# Patient Record
Sex: Male | Born: 1990 | Race: Black or African American | Hispanic: No | Marital: Single | Smoking: Former smoker
Health system: Southern US, Community
[De-identification: ages and names within clinical notes are randomized; demographics above are authoritative.]

## PROBLEM LIST (undated history)

## (undated) DIAGNOSIS — Z8614 Personal history of Methicillin resistant Staphylococcus aureus infection: Secondary | ICD-10-CM

## (undated) DIAGNOSIS — S02609A Fracture of mandible, unspecified, initial encounter for closed fracture: Secondary | ICD-10-CM

## (undated) DIAGNOSIS — Z969 Presence of functional implant, unspecified: Secondary | ICD-10-CM

## (undated) DIAGNOSIS — M2652 Limited mandibular range of motion: Secondary | ICD-10-CM

## (undated) DEATH — deceased

---
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
## 2009-06-10 ENCOUNTER — Emergency Department (HOSPITAL_COMMUNITY): Admission: EM | Admit: 2009-06-10 | Discharge: 2009-06-10 | Payer: Self-pay | Admitting: Emergency Medicine

## 2009-07-27 ENCOUNTER — Emergency Department (HOSPITAL_COMMUNITY): Admission: EM | Admit: 2009-07-27 | Discharge: 2009-07-27 | Payer: Self-pay | Admitting: Emergency Medicine

## 2009-11-30 ENCOUNTER — Emergency Department (HOSPITAL_COMMUNITY): Admission: EM | Admit: 2009-11-30 | Discharge: 2009-12-01 | Payer: Self-pay | Admitting: Emergency Medicine

## 2010-09-06 ENCOUNTER — Observation Stay (HOSPITAL_COMMUNITY): Admission: EM | Admit: 2010-09-06 | Discharge: 2010-09-07 | Payer: Self-pay | Admitting: Emergency Medicine

## 2010-12-18 ENCOUNTER — Emergency Department (HOSPITAL_COMMUNITY)
Admission: EM | Admit: 2010-12-18 | Discharge: 2010-12-18 | Disposition: A | Discharge status: Home / Self Care | Payer: Self-pay | Attending: Emergency Medicine | Admitting: Emergency Medicine

## 2010-12-18 DIAGNOSIS — R109 Unspecified abdominal pain: Secondary | ICD-10-CM | POA: Insufficient documentation

## 2010-12-18 DIAGNOSIS — K921 Melena: Secondary | ICD-10-CM | POA: Insufficient documentation

## 2010-12-18 DIAGNOSIS — R112 Nausea with vomiting, unspecified: Secondary | ICD-10-CM | POA: Insufficient documentation

## 2010-12-18 DIAGNOSIS — R197 Diarrhea, unspecified: Secondary | ICD-10-CM | POA: Insufficient documentation

## 2010-12-18 LAB — URINALYSIS, ROUTINE W REFLEX MICROSCOPIC
Specific Gravity, Urine: 1.03 (ref 1.005–1.030)
Urine Glucose, Fasting: 100 mg/dL — AB
pH: 8 (ref 5.0–8.0)

## 2010-12-18 LAB — CBC
MCHC: 36.4 g/dL — ABNORMAL HIGH (ref 30.0–36.0)
MCV: 83 fL (ref 78.0–100.0)
Platelets: 257 10*3/uL (ref 150–400)
RDW: 12.7 % (ref 11.5–15.5)
WBC: 10.4 10*3/uL (ref 4.0–10.5)

## 2010-12-18 LAB — DIFFERENTIAL
Eosinophils Absolute: 0 10*3/uL (ref 0.0–0.7)
Eosinophils Relative: 0 % (ref 0–5)
Lymphs Abs: 1.2 10*3/uL (ref 0.7–4.0)

## 2010-12-18 LAB — URINE MICROSCOPIC-ADD ON

## 2010-12-18 LAB — COMPREHENSIVE METABOLIC PANEL
Albumin: 4.9 g/dL (ref 3.5–5.2)
Alkaline Phosphatase: 72 U/L (ref 39–117)
BUN: 14 mg/dL (ref 6–23)
Calcium: 10.1 mg/dL (ref 8.4–10.5)
Creatinine, Ser: 0.93 mg/dL (ref 0.4–1.5)
Potassium: 4.4 mEq/L (ref 3.5–5.1)
Total Protein: 8.4 g/dL — ABNORMAL HIGH (ref 6.0–8.3)

## 2010-12-19 ENCOUNTER — Emergency Department (HOSPITAL_COMMUNITY)
Admission: EM | Admit: 2010-12-19 | Discharge: 2010-12-19 | Disposition: A | Discharge status: Home / Self Care | Payer: Self-pay | Attending: Emergency Medicine | Admitting: Emergency Medicine

## 2010-12-19 DIAGNOSIS — R1013 Epigastric pain: Secondary | ICD-10-CM | POA: Insufficient documentation

## 2010-12-19 DIAGNOSIS — R112 Nausea with vomiting, unspecified: Secondary | ICD-10-CM | POA: Insufficient documentation

## 2010-12-19 LAB — URINE MICROSCOPIC-ADD ON

## 2010-12-19 LAB — CBC
HCT: 42.3 % (ref 39.0–52.0)
Hemoglobin: 15.6 g/dL (ref 13.0–17.0)
MCHC: 36.9 g/dL — ABNORMAL HIGH (ref 30.0–36.0)

## 2010-12-19 LAB — DIFFERENTIAL
Basophils Absolute: 0 10*3/uL (ref 0.0–0.1)
Eosinophils Relative: 0 % (ref 0–5)
Lymphocytes Relative: 29 % (ref 12–46)
Monocytes Relative: 11 % (ref 3–12)
Neutro Abs: 6.6 10*3/uL (ref 1.7–7.7)

## 2010-12-19 LAB — COMPREHENSIVE METABOLIC PANEL
ALT: 17 U/L (ref 0–53)
AST: 29 U/L (ref 0–37)
Albumin: 4.8 g/dL (ref 3.5–5.2)
Alkaline Phosphatase: 67 U/L (ref 39–117)
CO2: 22 mEq/L (ref 19–32)
Calcium: 9.7 mg/dL (ref 8.4–10.5)
Chloride: 98 mEq/L (ref 96–112)
Creatinine, Ser: 1.03 mg/dL (ref 0.4–1.5)
Glucose, Bld: 124 mg/dL — ABNORMAL HIGH (ref 70–99)

## 2010-12-19 LAB — URINALYSIS, ROUTINE W REFLEX MICROSCOPIC
Hgb urine dipstick: NEGATIVE
Specific Gravity, Urine: 1.023 (ref 1.005–1.030)
Urine Glucose, Fasting: NEGATIVE mg/dL

## 2010-12-19 LAB — LIPASE, BLOOD: Lipase: 20 U/L (ref 11–59)

## 2010-12-21 ENCOUNTER — Emergency Department (HOSPITAL_COMMUNITY)
Admission: EM | Admit: 2010-12-21 | Discharge: 2010-12-22 | Discharge status: Against Medical Advice | Payer: Self-pay | Attending: Emergency Medicine | Admitting: Emergency Medicine

## 2010-12-21 DIAGNOSIS — Z0389 Encounter for observation for other suspected diseases and conditions ruled out: Secondary | ICD-10-CM | POA: Insufficient documentation

## 2010-12-23 ENCOUNTER — Emergency Department (HOSPITAL_COMMUNITY)
Admission: EM | Admit: 2010-12-23 | Discharge: 2010-12-23 | Disposition: A | Discharge status: Home / Self Care | Payer: Self-pay | Attending: Emergency Medicine | Admitting: Emergency Medicine

## 2010-12-23 ENCOUNTER — Emergency Department (HOSPITAL_COMMUNITY): Payer: Self-pay

## 2010-12-23 DIAGNOSIS — R1013 Epigastric pain: Secondary | ICD-10-CM | POA: Insufficient documentation

## 2010-12-23 DIAGNOSIS — R112 Nausea with vomiting, unspecified: Secondary | ICD-10-CM | POA: Insufficient documentation

## 2010-12-23 LAB — POCT I-STAT, CHEM 8
Glucose, Bld: 92 mg/dL (ref 70–99)
HCT: 46 % (ref 39.0–52.0)
Hemoglobin: 15.6 g/dL (ref 13.0–17.0)
Potassium: 3.4 mEq/L — ABNORMAL LOW (ref 3.5–5.1)
TCO2: 24 mmol/L (ref 0–100)

## 2010-12-23 LAB — OCCULT BLOOD, POC DEVICE: Fecal Occult Bld: NEGATIVE

## 2010-12-24 DIAGNOSIS — Z8614 Personal history of Methicillin resistant Staphylococcus aureus infection: Secondary | ICD-10-CM

## 2010-12-24 HISTORY — DX: Personal history of Methicillin resistant Staphylococcus aureus infection: Z86.14

## 2011-01-05 LAB — COMPREHENSIVE METABOLIC PANEL
ALT: 10 U/L (ref 0–53)
AST: 20 U/L (ref 0–37)
Albumin: 3.7 g/dL (ref 3.5–5.2)
Calcium: 9.1 mg/dL (ref 8.4–10.5)
GFR calc Af Amer: >60 mL/min (ref >60)
Sodium: 139 mEq/L (ref 135–145)
Total Protein: 6.7 g/dL (ref 6.0–8.3)

## 2011-01-05 LAB — DIFFERENTIAL
Eosinophils Absolute: 0.1 10*3/uL (ref 0.0–0.7)
Eosinophils Relative: 1 % (ref 0–5)
Lymphocytes Relative: 15 % (ref 12–46)
Lymphs Abs: 1.5 10*3/uL (ref 0.7–4.0)
Monocytes Relative: 10 % (ref 3–12)

## 2011-01-05 LAB — CBC
Hemoglobin: 14.5 g/dL (ref 13.0–17.0)
MCHC: 35.4 g/dL (ref 30.0–36.0)
Platelets: 162 10*3/uL (ref 150–400)
RDW: 13.1 % (ref 11.5–15.5)

## 2011-01-08 ENCOUNTER — Inpatient Hospital Stay (HOSPITAL_COMMUNITY)
Admission: EM | Admit: 2011-01-08 | Discharge: 2011-01-12 | DRG: 513 | Attending: Internal Medicine | Admitting: Internal Medicine

## 2011-01-08 ENCOUNTER — Emergency Department (HOSPITAL_COMMUNITY)

## 2011-01-08 DIAGNOSIS — R042 Hemoptysis: Secondary | ICD-10-CM | POA: Diagnosis present

## 2011-01-08 DIAGNOSIS — J9600 Acute respiratory failure, unspecified whether with hypoxia or hypercapnia: Secondary | ICD-10-CM | POA: Diagnosis not present

## 2011-01-08 DIAGNOSIS — K2970 Gastritis, unspecified, without bleeding: Secondary | ICD-10-CM | POA: Diagnosis present

## 2011-01-08 DIAGNOSIS — F17200 Nicotine dependence, unspecified, uncomplicated: Secondary | ICD-10-CM | POA: Diagnosis present

## 2011-01-08 DIAGNOSIS — K2990 Gastroduodenitis, unspecified, without bleeding: Secondary | ICD-10-CM | POA: Diagnosis present

## 2011-01-08 DIAGNOSIS — J811 Chronic pulmonary edema: Secondary | ICD-10-CM | POA: Diagnosis not present

## 2011-01-08 DIAGNOSIS — M659 Unspecified synovitis and tenosynovitis, unspecified site: Principal | ICD-10-CM | POA: Diagnosis present

## 2011-01-08 HISTORY — PX: TRIGGER FINGER RELEASE: SHX641

## 2011-01-08 HISTORY — PX: FINGER DEBRIDEMENT: SHX1634

## 2011-01-08 LAB — CBC
HCT: 40 % (ref 39.0–52.0)
Hemoglobin: 14.6 g/dL (ref 13.0–17.0)
MCH: 31 pg (ref 26.0–34.0)
MCHC: 36.5 g/dL — ABNORMAL HIGH (ref 30.0–36.0)
MCV: 84.9 fL (ref 78.0–100.0)
Platelets: 264 10*3/uL (ref 150–400)
RBC: 4.71 MIL/uL (ref 4.22–5.81)
RDW: 12.1 % (ref 11.5–15.5)
WBC: 10.3 K/uL (ref 4.0–10.5)

## 2011-01-08 LAB — DIFFERENTIAL
Basophils Absolute: 0 K/uL (ref 0.0–0.1)
Basophils Relative: 0 % (ref 0–1)
Eosinophils Absolute: 0.1 10*3/uL (ref 0.0–0.7)
Eosinophils Relative: 1 % (ref 0–5)
Lymphocytes Relative: 25 % (ref 12–46)
Lymphs Abs: 2.6 10*3/uL (ref 0.7–4.0)
Monocytes Absolute: 1.1 K/uL — ABNORMAL HIGH (ref 0.1–1.0)
Monocytes Relative: 11 % (ref 3–12)
Neutro Abs: 6.5 K/uL (ref 1.7–7.7)
Neutrophils Relative %: 63 % (ref 43–77)

## 2011-01-09 ENCOUNTER — Inpatient Hospital Stay (HOSPITAL_COMMUNITY)

## 2011-01-09 DIAGNOSIS — R042 Hemoptysis: Secondary | ICD-10-CM

## 2011-01-09 DIAGNOSIS — J168 Pneumonia due to other specified infectious organisms: Secondary | ICD-10-CM

## 2011-01-09 DIAGNOSIS — R0902 Hypoxemia: Secondary | ICD-10-CM

## 2011-01-09 LAB — CARDIAC PANEL(CRET KIN+CKTOT+MB+TROPI)
CK, MB: 1.8 ng/mL (ref 0.3–4.0)
Relative Index: INVALID (ref 0.0–2.5)
Relative Index: INVALID (ref 0.0–2.5)
Total CK: 60 U/L (ref 7–232)
Troponin I: 0.01 ng/mL (ref 0.00–0.06)

## 2011-01-09 LAB — GLUCOSE, CAPILLARY: Glucose-Capillary: 140 mg/dL — ABNORMAL HIGH (ref 70–99)

## 2011-01-09 LAB — BLOOD GAS, ARTERIAL
Acid-Base Excess: 2.4 mmol/L — ABNORMAL HIGH (ref 0.0–2.0)
Drawn by: 29925
O2 Saturation: 90.9 %
TCO2: 28.2 mmol/L (ref 0–100)
pCO2 arterial: 44.5 mmHg (ref 35.0–45.0)
pO2, Arterial: 60.8 mmHg — ABNORMAL LOW (ref 80.0–100.0)

## 2011-01-09 LAB — MRSA PCR SCREENING: MRSA by PCR: NEGATIVE

## 2011-01-10 ENCOUNTER — Inpatient Hospital Stay (HOSPITAL_COMMUNITY)

## 2011-01-10 DIAGNOSIS — R0902 Hypoxemia: Secondary | ICD-10-CM

## 2011-01-10 DIAGNOSIS — J168 Pneumonia due to other specified infectious organisms: Secondary | ICD-10-CM

## 2011-01-10 DIAGNOSIS — R042 Hemoptysis: Secondary | ICD-10-CM

## 2011-01-10 LAB — BASIC METABOLIC PANEL
BUN: 6 mg/dL (ref 6–23)
CO2: 32 mEq/L (ref 19–32)
Calcium: 8.9 mg/dL (ref 8.4–10.5)
GFR calc non Af Amer: >60 mL/min (ref >60)
Glucose, Bld: 89 mg/dL (ref 70–99)
Sodium: 140 mEq/L (ref 135–145)

## 2011-01-10 LAB — CBC
HCT: 39.5 % (ref 39.0–52.0)
Hemoglobin: 13.7 g/dL (ref 13.0–17.0)
MCHC: 34.7 g/dL (ref 30.0–36.0)
MCV: 85.1 fL (ref 78.0–100.0)
RDW: 12.2 % (ref 11.5–15.5)

## 2011-01-10 LAB — HIV ANTIBODY (ROUTINE TESTING W REFLEX): HIV: NONREACTIVE

## 2011-01-11 ENCOUNTER — Inpatient Hospital Stay (HOSPITAL_COMMUNITY)

## 2011-01-11 LAB — WOUND CULTURE

## 2011-01-12 LAB — QUANTIFERON TB GOLD ASSAY (BLOOD)
Interferon Gamma Release Assay: POSITIVE — AB
Mitogen Minus Nil Value: >10 IU/mL
Quantiferon Nil Value: 0.04 IU/mL

## 2011-01-12 LAB — CBC
HCT: 37 % — ABNORMAL LOW (ref 39.0–52.0)
MCV: 84.7 fL (ref 78.0–100.0)
RBC: 4.37 MIL/uL (ref 4.22–5.81)
RDW: 11.7 % (ref 11.5–15.5)
WBC: 6.4 10*3/uL (ref 4.0–10.5)

## 2011-01-12 LAB — BASIC METABOLIC PANEL
Chloride: 103 mEq/L (ref 96–112)
GFR calc non Af Amer: >60 mL/min (ref >60)
Glucose, Bld: 94 mg/dL (ref 70–99)
Potassium: 3.5 mEq/L (ref 3.5–5.1)
Sodium: 138 mEq/L (ref 135–145)

## 2011-01-13 LAB — DIFFERENTIAL
Eosinophils Absolute: 0.1 10*3/uL (ref 0.0–0.7)
Eosinophils Relative: 1 % (ref 0–5)
Lymphocytes Relative: 36 % (ref 12–46)
Lymphs Abs: 2.9 10*3/uL (ref 0.7–4.0)
Monocytes Absolute: 0.8 10*3/uL (ref 0.1–1.0)

## 2011-01-13 LAB — POCT I-STAT, CHEM 8
BUN: 14 mg/dL (ref 6–23)
Calcium, Ion: 1.13 mmol/L (ref 1.12–1.32)
Creatinine, Ser: 0.9 mg/dL (ref 0.4–1.5)
Glucose, Bld: 96 mg/dL (ref 70–99)
TCO2: 31 mmol/L (ref 0–100)

## 2011-01-13 LAB — ANAEROBIC CULTURE

## 2011-01-13 LAB — RAPID URINE DRUG SCREEN, HOSP PERFORMED
Barbiturates: NOT DETECTED
Cocaine: NOT DETECTED

## 2011-01-13 LAB — CBC
HCT: 41.5 % (ref 39.0–52.0)
Hemoglobin: 14.2 g/dL (ref 13.0–17.0)
MCV: 89.4 fL (ref 78.0–100.0)
WBC: 8.3 10*3/uL (ref 4.0–10.5)

## 2011-01-13 LAB — ETHANOL: Alcohol, Ethyl (B): <5 mg/dL (ref 0–10)

## 2011-01-15 LAB — CULTURE, BLOOD (ROUTINE X 2)
Culture  Setup Time: 201203171746
Culture: NO GROWTH
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
## 2011-01-28 LAB — URINALYSIS, ROUTINE W REFLEX MICROSCOPIC
Bilirubin Urine: NEGATIVE
Leukocytes, UA: NEGATIVE
Nitrite: NEGATIVE
Specific Gravity, Urine: 1.022 (ref 1.005–1.030)
pH: 7 (ref 5.0–8.0)

## 2011-01-28 LAB — LIPASE, BLOOD: Lipase: 15 U/L (ref 11–59)

## 2011-01-28 LAB — COMPREHENSIVE METABOLIC PANEL
AST: 41 U/L — ABNORMAL HIGH (ref 0–37)
CO2: 22 mEq/L (ref 19–32)
Calcium: 10.2 mg/dL (ref 8.4–10.5)
Creatinine, Ser: 0.77 mg/dL (ref 0.4–1.5)
GFR calc Af Amer: >60 mL/min (ref >60)
GFR calc non Af Amer: >60 mL/min (ref >60)
Total Protein: 8.6 g/dL — ABNORMAL HIGH (ref 6.0–8.3)

## 2011-01-28 LAB — CBC
MCHC: 34.2 g/dL (ref 30.0–36.0)
MCV: 90.2 fL (ref 78.0–100.0)
Platelets: 231 10*3/uL (ref 150–400)
RBC: 5.14 MIL/uL (ref 4.22–5.81)
RDW: 13.5 % (ref 11.5–15.5)

## 2011-01-28 LAB — URINE MICROSCOPIC-ADD ON

## 2011-01-28 LAB — DIFFERENTIAL
Eosinophils Relative: 0 % (ref 0–5)
Lymphocytes Relative: 5 % — ABNORMAL LOW (ref 12–46)
Lymphs Abs: 0.9 10*3/uL (ref 0.7–4.0)

## 2011-01-30 LAB — CBC
MCHC: 34.5 g/dL (ref 30.0–36.0)
Platelets: 239 10*3/uL (ref 150–400)
RDW: 13.1 % (ref 11.5–15.5)

## 2011-01-30 LAB — DIFFERENTIAL
Eosinophils Absolute: 0.2 10*3/uL (ref 0.0–0.7)
Eosinophils Relative: 2 % (ref 0–5)
Lymphs Abs: 5.1 10*3/uL — ABNORMAL HIGH (ref 0.7–4.0)
Monocytes Relative: 8 % (ref 3–12)

## 2011-01-30 LAB — COMPREHENSIVE METABOLIC PANEL
ALT: 12 U/L (ref 0–53)
AST: 27 U/L (ref 0–37)
Calcium: 9.4 mg/dL (ref 8.4–10.5)
GFR calc Af Amer: >60 mL/min (ref >60)
Sodium: 138 mEq/L (ref 135–145)
Total Protein: 7.6 g/dL (ref 6.0–8.3)

## 2011-02-08 LAB — LIPASE, BLOOD: Lipase: 21 U/L (ref 11–59)

## 2011-02-08 LAB — DIFFERENTIAL
Basophils Absolute: 0 10*3/uL (ref 0.0–0.1)
Basophils Relative: 0 % (ref 0–1)
Lymphocytes Relative: 26 % (ref 12–46)
Monocytes Absolute: 0.8 10*3/uL (ref 0.1–1.0)
Neutro Abs: 6.2 10*3/uL (ref 1.7–7.7)
Neutrophils Relative %: 65 % (ref 43–77)

## 2011-02-08 LAB — URINALYSIS, ROUTINE W REFLEX MICROSCOPIC
Nitrite: NEGATIVE
Protein, ur: 30 mg/dL — AB
Specific Gravity, Urine: 1.019 (ref 1.005–1.030)
Urobilinogen, UA: 0.2 mg/dL (ref 0.0–1.0)

## 2011-02-08 LAB — COMPREHENSIVE METABOLIC PANEL
Albumin: 4.5 g/dL (ref 3.5–5.2)
BUN: 7 mg/dL (ref 6–23)
Chloride: 102 mEq/L (ref 96–112)
Creatinine, Ser: 0.81 mg/dL (ref 0.4–1.5)
GFR calc non Af Amer: >60 mL/min (ref >60)
Glucose, Bld: 119 mg/dL — ABNORMAL HIGH (ref 70–99)
Total Bilirubin: 0.9 mg/dL (ref 0.3–1.2)

## 2011-02-08 LAB — CBC
HCT: 44.1 % (ref 39.0–52.0)
Hemoglobin: 14.8 g/dL (ref 13.0–17.0)
MCV: 87.9 fL (ref 78.0–100.0)
Platelets: 244 10*3/uL (ref 150–400)
WBC: 9.6 10*3/uL (ref 4.0–10.5)

## 2011-02-08 LAB — URINE MICROSCOPIC-ADD ON

## 2011-02-09 LAB — DIFFERENTIAL
Basophils Absolute: 0 10*3/uL (ref 0.0–0.1)
Eosinophils Relative: 0 % (ref 0–5)
Lymphocytes Relative: 14 % (ref 12–46)
Lymphs Abs: 0.8 10*3/uL (ref 0.7–4.0)
Monocytes Absolute: 0.4 10*3/uL (ref 0.1–1.0)

## 2011-02-09 LAB — COMPREHENSIVE METABOLIC PANEL
ALT: 12 U/L (ref 0–53)
AST: 16 U/L (ref 0–37)
Albumin: 4.3 g/dL (ref 3.5–5.2)
Chloride: 106 mEq/L (ref 96–112)
Creatinine, Ser: 0.61 mg/dL (ref 0.4–1.5)
GFR calc Af Amer: >60 mL/min (ref >60)
Potassium: 3.7 mEq/L (ref 3.5–5.1)
Sodium: 140 mEq/L (ref 135–145)
Total Bilirubin: 0.9 mg/dL (ref 0.3–1.2)

## 2011-02-09 LAB — URINE CULTURE: Colony Count: NO GROWTH

## 2011-02-09 LAB — URINALYSIS, ROUTINE W REFLEX MICROSCOPIC
Glucose, UA: NEGATIVE mg/dL
Hgb urine dipstick: NEGATIVE
Specific Gravity, Urine: 1.014 (ref 1.005–1.030)

## 2011-02-09 LAB — CBC
MCV: 87.3 fL (ref 78.0–100.0)
Platelets: 202 10*3/uL (ref 150–400)
WBC: 6.1 10*3/uL (ref 4.0–10.5)

## 2011-02-09 LAB — URINE MICROSCOPIC-ADD ON

## 2011-02-10 NOTE — Op Note (Signed)
Norman Perez, AUDI NO.:  [id number]  MEDICAL RECORD NO.:  [id number]           PATIENT TYPE:  I  LOCATION:  2111                         FACILITY:  [facility]  PHYSICIAN:  Loreta Ave, MD DATE OF BIRTH:  1991-08-23  DATE OF PROCEDURE:  01/08/2011 DATE OF DISCHARGE:                              OPERATIVE REPORT   PREOPERATIVE DIAGNOSIS:  Right long finger flexor tenosynovitis.  POSTOPERATIVE DIAGNOSIS:  Right long finger flexor tenosynovitis.  PROCEDURE PERFORMED: 1. Debridement of infected skin and devitalized tissue, right long     finger. 2. I&D of flexor tendon sheath at the level of the proximal phalanx. 3. A1 pulley release of the right long finger.  SURGEON:  Loreta Ave, MD  ANESTHESIA:  General.  TOURNIQUET TIME:  25 minutes and 250 mmHg.  SPECIMENS:  Purulence was sent to microbiology for Gram stain culture.  ESTIMATED BLOOD LOSS:  Minimal.  CLINICAL INDICATION:  Norman Perez is a 20 year old right-hand-dominant male with a 4-5 day history of right long finger pain, erythema, and swelling.  He came to the [facility] Emergency Room today and I was asked to see him as the hand surgeon on-call.  Clinical exam was suggestive of flexor tenosynovitis with pain along the flexor tendon sheath into the palm, flexed posture of the digit, pain with passive extension, a fusiform swelling of the long finger.  He had obvious fluctuance of 2 x 3 cm area over the volar long finger proximal phalanx. I reviewed his clinical examination and recommended incision and drainage.  The patient understood the risks of surgery to include but not be limited to bleeding, infection, damage to nearby structures, stiffness, scarring, loss of motion of the long finger as well as the need for more surgery and potentially an amputation of the digit.  He desired to proceed.  DESCRIPTION OF OPERATION:  The patient was brought to the operating room, placed in  supine position on the operating room table.  After smooth and routine induction of general anesthesia, the right upper extremity was prepped with Betadine scrub and paint, draped into a sterile field.  A well-padded pneumatic tourniquet was placed on the arm.  The right upper extremity was elevated for 1 minute with compression of the brachial artery and the tourniquet was inflated to 250 mmHg.  Attention was turned to the proximal phalanx of the right long finger where there was maximal pointing on the volar skin.  This was incised along the mid lateral line and purulence was encountered. This was cultured and sent to microbiology.  Next, the devitalized skin overlying the bulla was debrided with tenotomy scissors.  There was a deeper rent in the skin along the mid palmar surface of the proximal phalanx that communicated with the flexor tendon sheath.  This was incised with a 15 blade for approximately 1 cm longitudinally.  More pus was encountered at this location.  Next, a fresh 15 blade was used to make an oblique incision at the level of the distal palmar crease overlying the long finger ray.  A blunt dissection was carried out to protect neurovascular structures and A1 pulley  was identified, pierced with a 15 blade, and incised with tenotomy scissors.  There was pus emanating from the distal aspect of the tendon sheath here, but no proximal pus could be expelled with palmar pressure.  Next, a 5-French pediatric feeding tube was passed into the tendon sheath from the wound in palm.  This was irrigated with 200 mL of normal saline after the effluent ran clear.  Next, this catheter was again placed in the tendon sheath, the spine at the level of the proximal phalanx and passed distally.  This was irrigated with 100 mL of normal saline.  However, there was minimal return of pus distally.  Next, the catheter was removed and both wounds were packed with quarter inch Iodoform gauze. 10 mL  of 0.5% Marcaine plain were injected into the palm as a digital block of long finger for postoperative analgesia.  Next, 4x4s and a volar resting splint and 2 additional functions were then applied.  The patient tolerated the procedure well and had his tourniquet deflated with prompt return of capillary refill to all digits of the right hand. He was then transferred to recovery room in stable condition.     Loreta Ave, MD     CF/MEDQ  D:  01/08/2011  T:  01/09/2011  Job:  [id number]  Electronically Signed by Loreta Ave MD on 02/10/2011 07:49:06 AM

## 2011-02-20 LAB — AFB CULTURE WITH SMEAR (NOT AT ARMC): Acid Fast Smear: NONE SEEN

## 2011-10-08 IMAGING — CR DG CHEST 1V PORT
1 series · 1 of 1 positions shown · non-contrast
Comparison: 01/08/2011

CLINICAL DATA: Short of breath.  Chest pain.

PORTABLE CHEST - 1 VIEW

[AP]
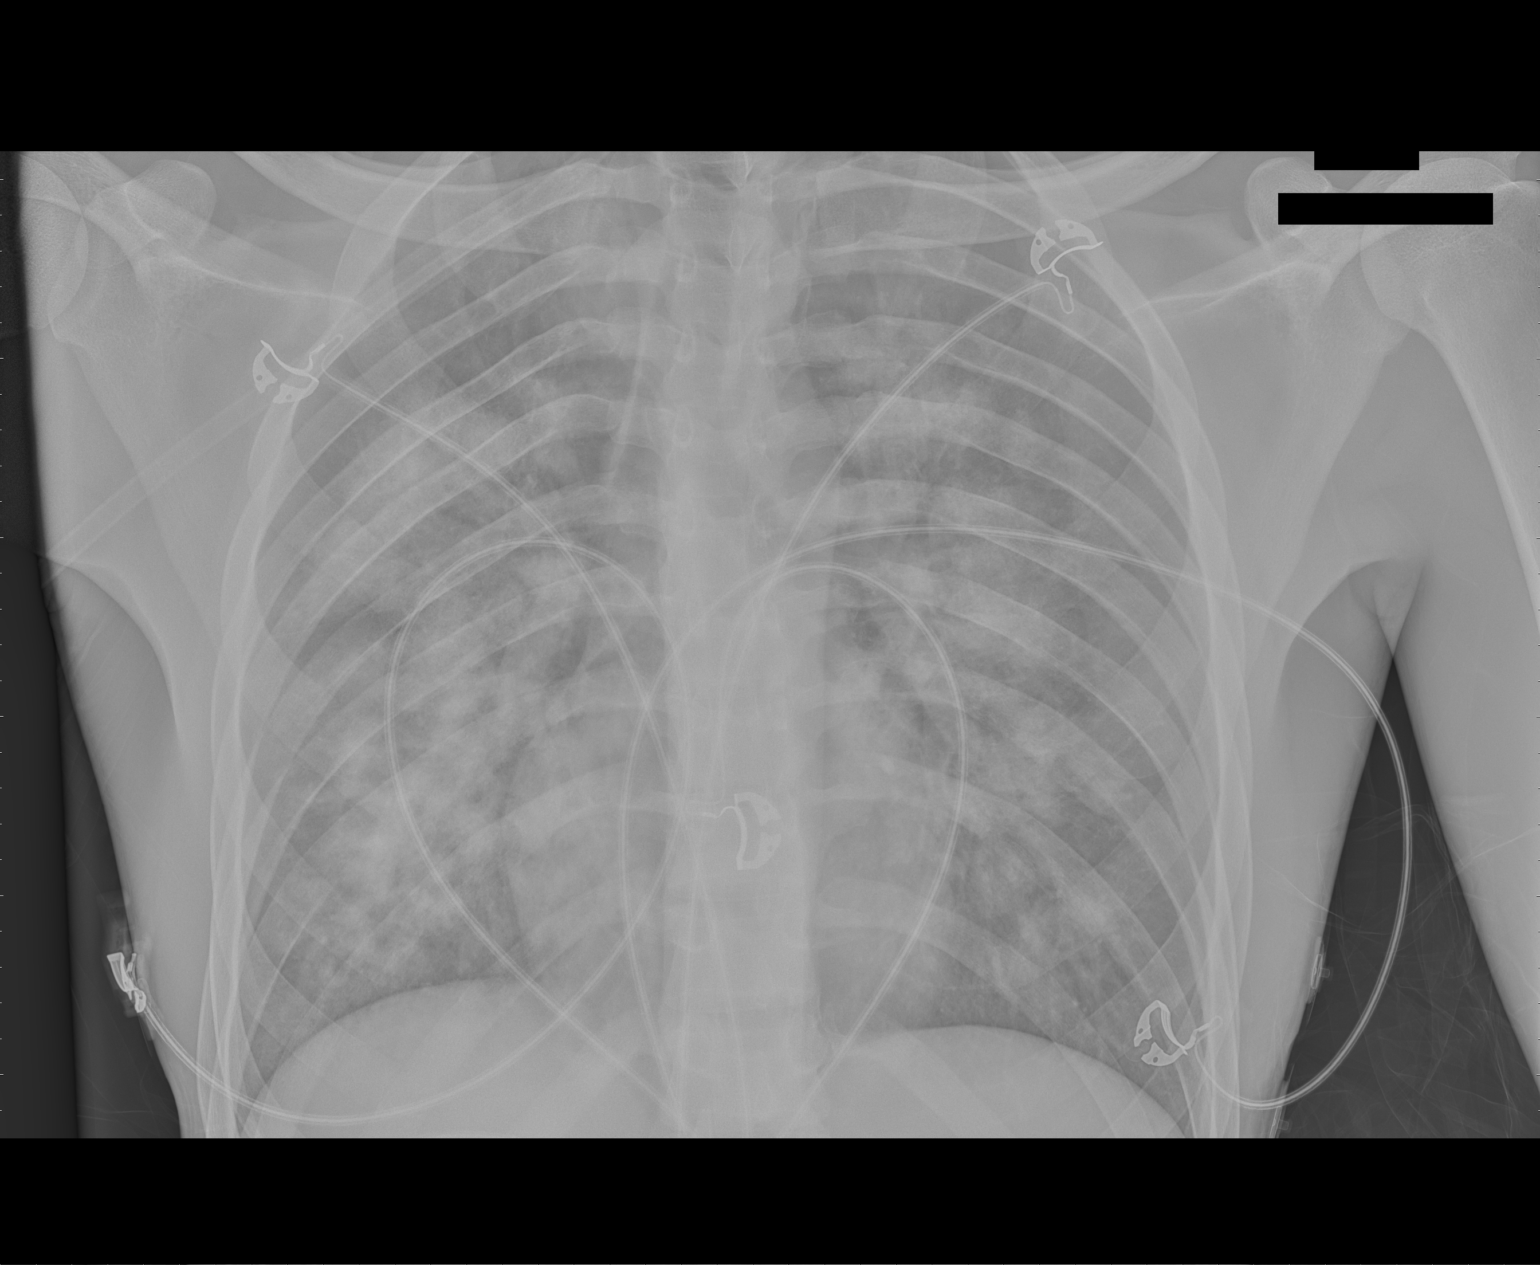

[1 of 1 positions shown; findings below may reference images not displayed]

FINDINGS: Diffuse bilateral patchy airspace opacities stable.  No
pneumothorax.  Hyperaeration.  Normal heart size.
IMPRESSION: Stable airspace disease.

## 2011-10-08 IMAGING — CT CT CHEST W/O CM
3 of 5 series · 17 of 28 positions shown, 19 images · non-contrast
Comparison: None.

CLINICAL DATA: Short of breath.  Chest pain.  Diffuse pulmonary air
space disease.

CT CHEST WITHOUT CONTRAST
TECHNIQUE: Multidetector CT imaging of the chest was performed
following the standard protocol without IV contrast.

[Series 2: high res std · axial · 0.73mm/px · z∈[-338,-283]mm · 2 of 69 slices shown]
[im 12/69  mediastinal]
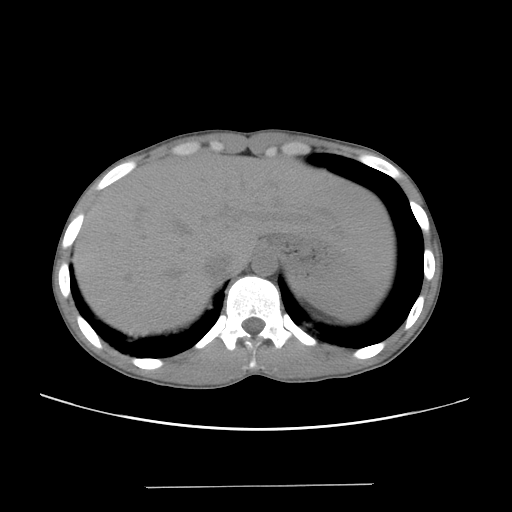
[im 23/69  mediastinal]
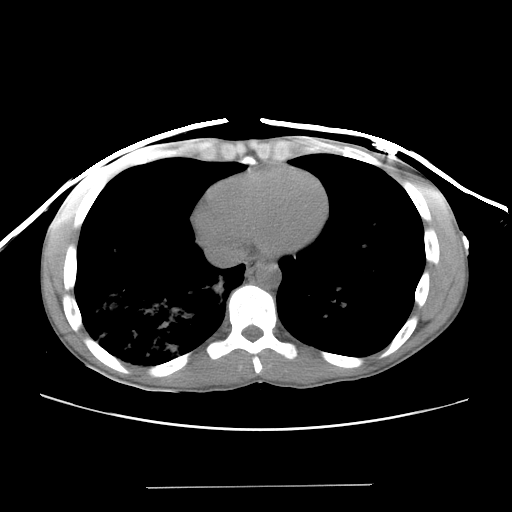

[Series 400: cor · coronal · 0.73mm/px · 7 of 89 slices shown]
[im 12/89  lung]
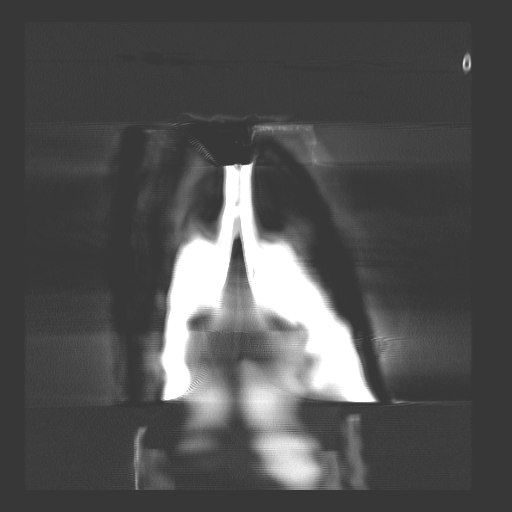
[im 23/89  lung]
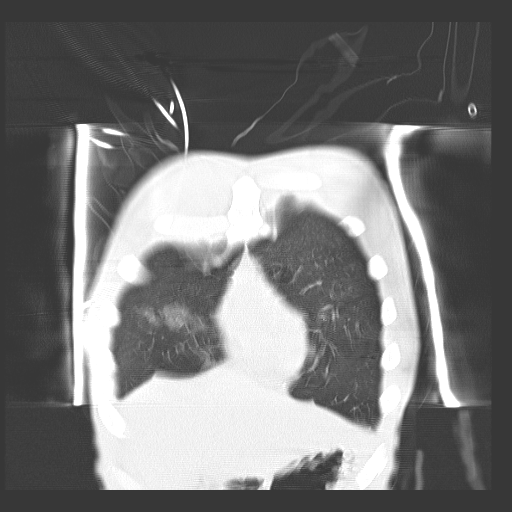
[im 34/89  lung]
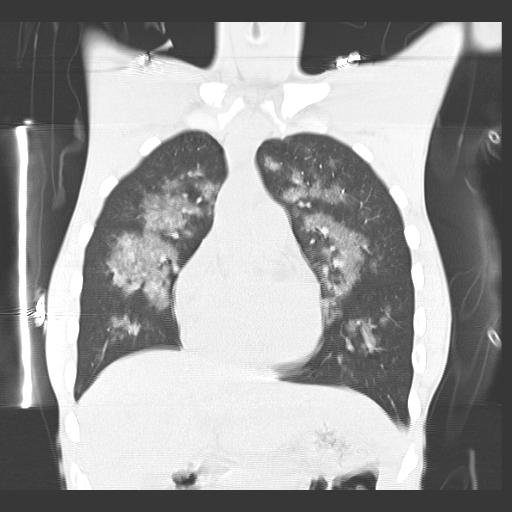
[im 45/89  lung]
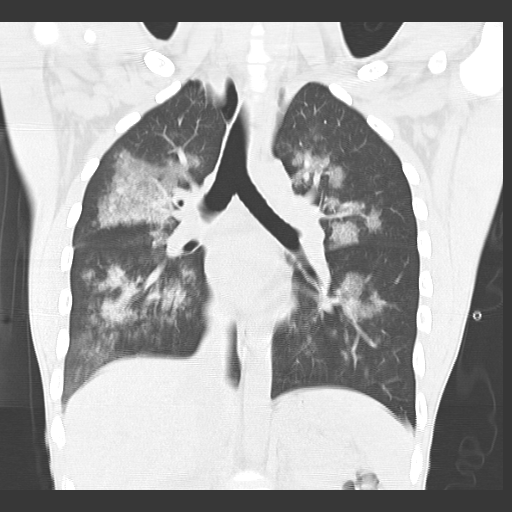
[im 56/89  lung]
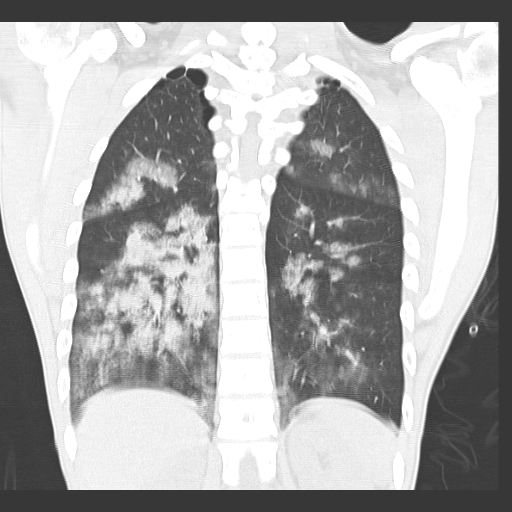
[im 67/89  lung]
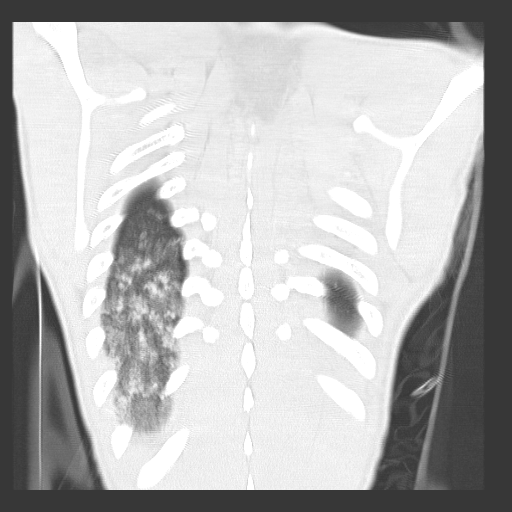
[im 78/89  lung]
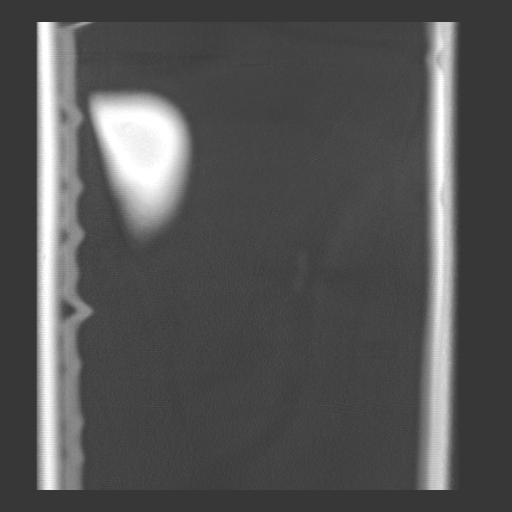

[Series 401: sag · sagittal · 0.73mm/px · 8 of 96 slices shown, 10 images]
[im 11/96  mediastinal]
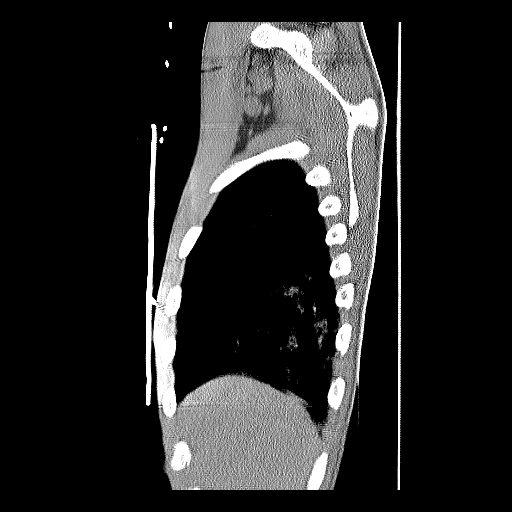
[im 11/96  lung]
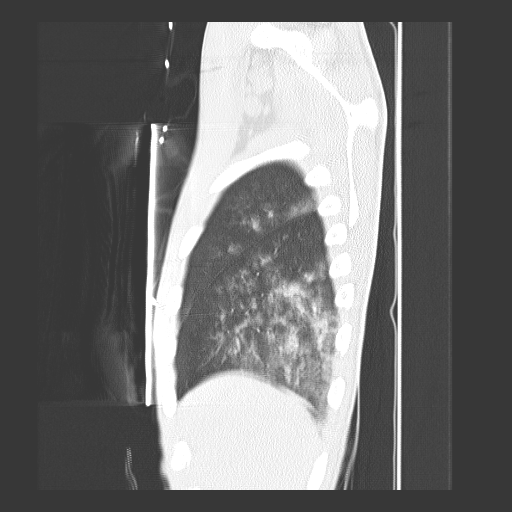
[im 22/96  lung]
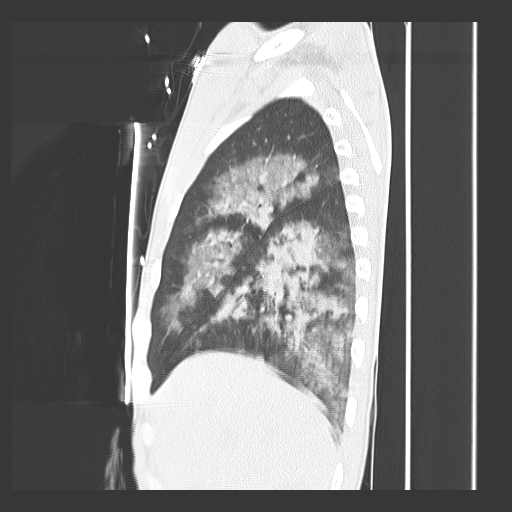
[im 32/96  lung]
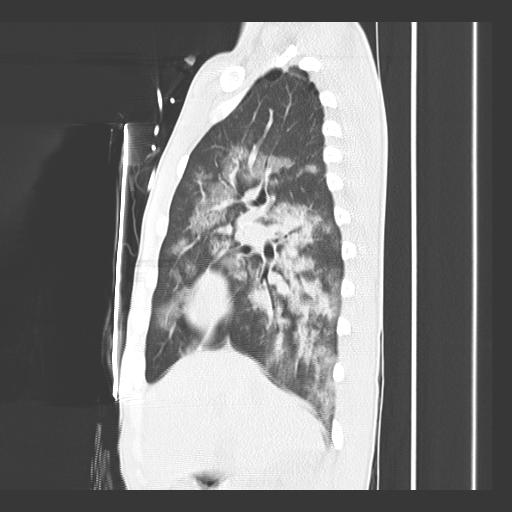
[im 43/96  lung]
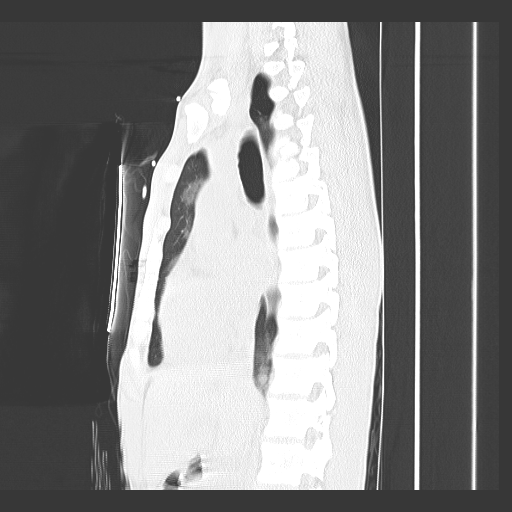
[im 53/96  mediastinal]
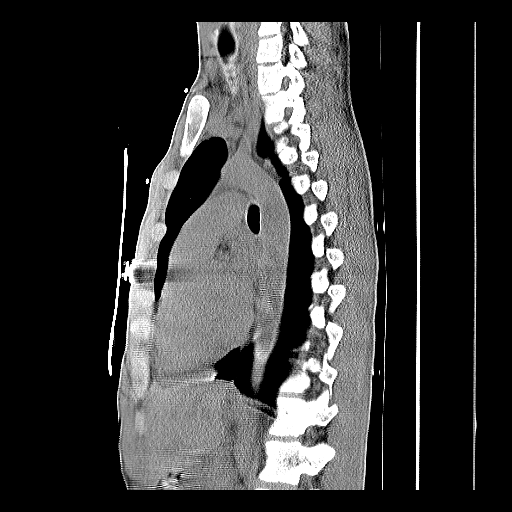
[im 53/96  lung]
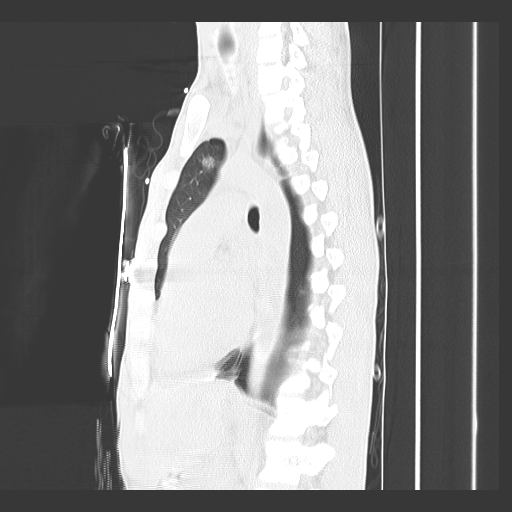
[im 64/96  lung]
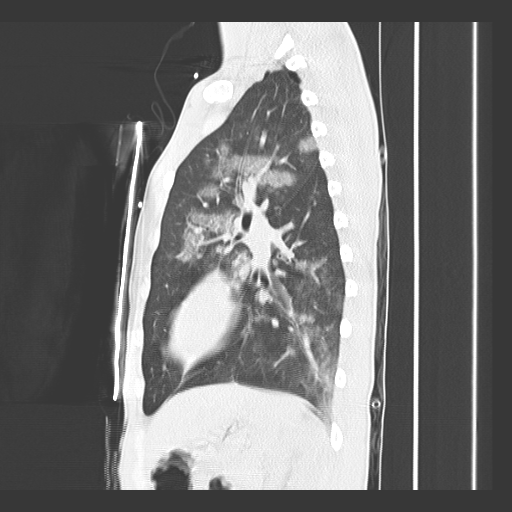
[im 74/96  lung]
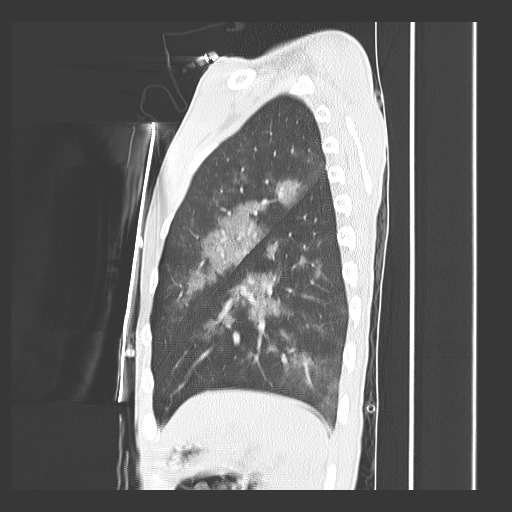
[im 85/96  lung]
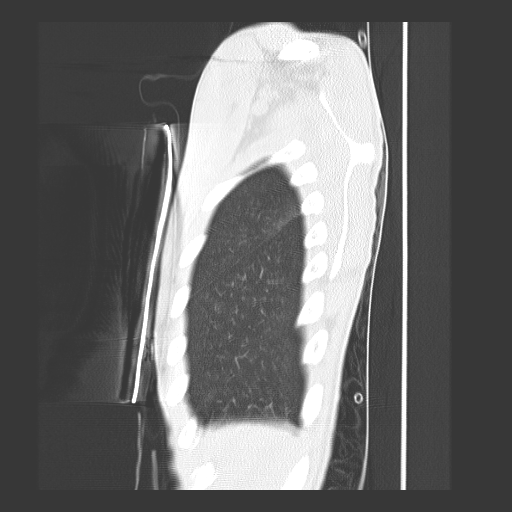

[17 of 28 positions shown; findings below may reference images not displayed]

FINDINGS: Relatively symmetric central perihilar air space disease
is seen bilaterally, with some asymmetric involvement seen in the
peripheral right lower lobe.  The distribution and appearance
favors hemorrhage, infectious, or other inflammatory process.
There is no evidence of parenchymal cavitation.  Central air
bronchograms are seen and the central tracheobronchial airways are
patent.  No evidence of centrally obstructing mass or
lymphadenopathy.

No discrete pulmonary masses are identified.  No evidence of
pleural or pericardial effusion.  No hilar or mediastinal
lymphadenopathy identified on this noncontrast study.  No evidence
of chest wall mass.
IMPRESSION: 1.  Bilateral perihilar air space disease with asymmetric
involvement in the peripheral right lower lobe. Most likely
differential considerations include hemorrhage, infectious, or
other inflammatory process.
2.  No evidence of pulmonary cavitation, abscess, or pleural
effusion.
3.  No mass or lymphadenopathy identified.

## 2011-10-09 IMAGING — CR DG CHEST 1V PORT
1 series · 1 of 1 positions shown · non-contrast
Comparison: Yesterday

CLINICAL DATA: Respiratory distress

PORTABLE CHEST - 1 VIEW

[view not recorded]
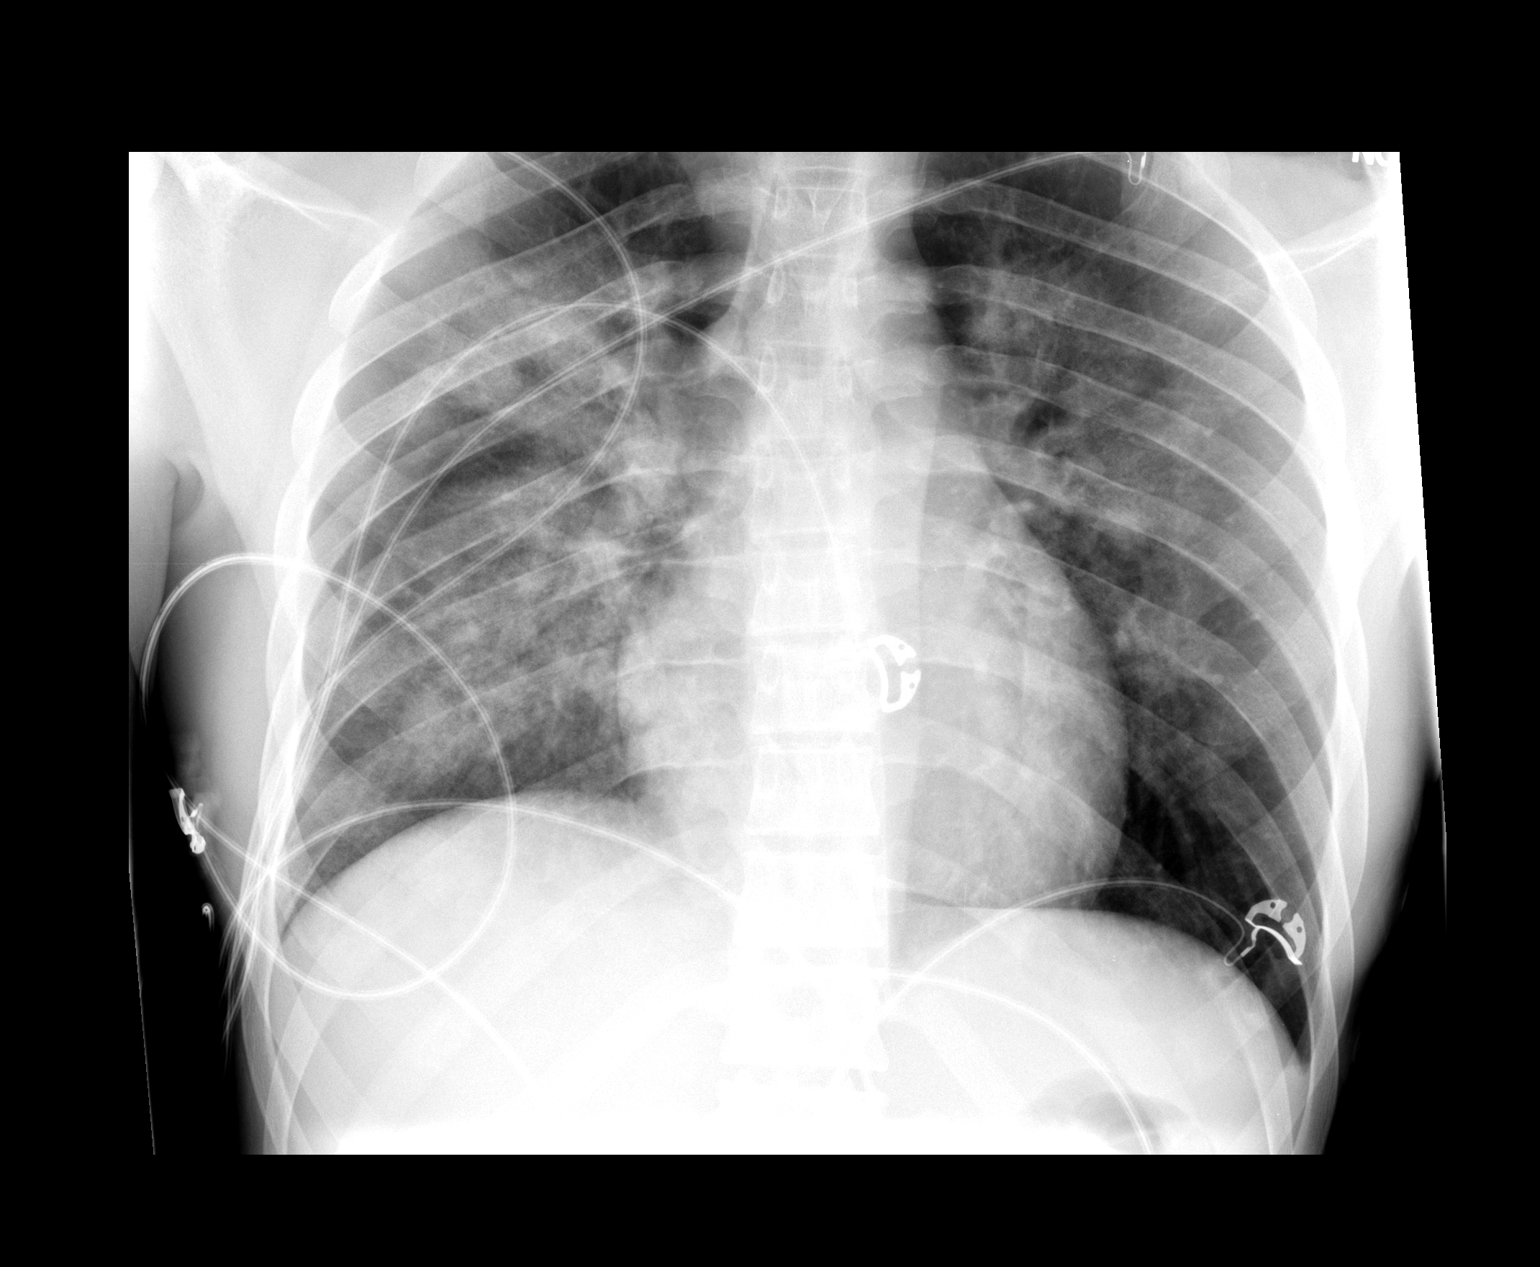

[1 of 1 positions shown; findings below may reference images not displayed]

FINDINGS: Bilateral airspace disease has improved.  Normal heart
size.  No pneumothorax.
IMPRESSION: Improved bilateral airspace disease.

## 2012-09-14 ENCOUNTER — Encounter (HOSPITAL_COMMUNITY): Payer: Self-pay

## 2012-09-14 ENCOUNTER — Emergency Department (HOSPITAL_COMMUNITY)
Admission: EM | Admit: 2012-09-14 | Discharge: 2012-09-15 | Disposition: A | Discharge status: Home / Self Care | Payer: Self-pay | Attending: Emergency Medicine | Admitting: Emergency Medicine

## 2012-09-14 DIAGNOSIS — S61411A Laceration without foreign body of right hand, initial encounter: Secondary | ICD-10-CM

## 2012-09-14 DIAGNOSIS — IMO0002 Reserved for concepts with insufficient information to code with codable children: Secondary | ICD-10-CM | POA: Insufficient documentation

## 2012-09-14 DIAGNOSIS — S61409A Unspecified open wound of unspecified hand, initial encounter: Secondary | ICD-10-CM | POA: Insufficient documentation

## 2012-09-14 DIAGNOSIS — Y9389 Activity, other specified: Secondary | ICD-10-CM | POA: Insufficient documentation

## 2012-09-14 DIAGNOSIS — F17200 Nicotine dependence, unspecified, uncomplicated: Secondary | ICD-10-CM | POA: Insufficient documentation

## 2012-09-14 DIAGNOSIS — Z23 Encounter for immunization: Secondary | ICD-10-CM | POA: Insufficient documentation

## 2012-09-14 DIAGNOSIS — Y929 Unspecified place or not applicable: Secondary | ICD-10-CM | POA: Insufficient documentation
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
## 2012-09-14 NOTE — ED Notes (Signed)
Suture cart at bedside 
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
## 2012-09-15 MED ORDER — BACITRACIN ZINC 500 UNIT/GM EX OINT: TOPICAL_OINTMENT | Freq: Two times a day (BID) | CUTANEOUS | Status: DC

## 2012-09-15 MED ORDER — TETANUS-DIPHTH-ACELL PERTUSSIS 5-2.5-18.5 LF-MCG/0.5 IM SUSP
0.5000 mL | Freq: Once | INTRAMUSCULAR | Status: AC
  Administered 2012-09-15: 0.5 mL via INTRAMUSCULAR
  Filled 2012-09-15: qty 0.5

## 2012-09-15 MED ORDER — BACITRACIN ZINC 500 UNIT/GM EX OINT
TOPICAL_OINTMENT | Freq: Two times a day (BID) | CUTANEOUS | Status: DC
  Administered 2012-09-15: 1 via TOPICAL
  Filled 2012-09-15: qty 15
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
## 2012-09-17 ENCOUNTER — Emergency Department (HOSPITAL_COMMUNITY): Payer: Self-pay | Admitting: Anesthesiology

## 2012-09-17 ENCOUNTER — Observation Stay (HOSPITAL_COMMUNITY)
Admission: EM | Admit: 2012-09-17 | Discharge: 2012-09-18 | Disposition: A | Discharge status: Home / Self Care | Payer: Self-pay | Attending: Otolaryngology | Admitting: Otolaryngology

## 2012-09-17 ENCOUNTER — Emergency Department (HOSPITAL_COMMUNITY): Payer: Self-pay

## 2012-09-17 ENCOUNTER — Encounter (HOSPITAL_COMMUNITY): Payer: Self-pay | Admitting: Anesthesiology

## 2012-09-17 ENCOUNTER — Encounter (HOSPITAL_COMMUNITY): Admission: EM | Disposition: A | Discharge status: Home / Self Care | Payer: Self-pay | Source: Home / Self Care

## 2012-09-17 ENCOUNTER — Encounter (HOSPITAL_COMMUNITY): Payer: Self-pay | Admitting: *Deleted

## 2012-09-17 DIAGNOSIS — S0993XA Unspecified injury of face, initial encounter: Secondary | ICD-10-CM

## 2012-09-17 DIAGNOSIS — S02609A Fracture of mandible, unspecified, initial encounter for closed fracture: Secondary | ICD-10-CM

## 2012-09-17 DIAGNOSIS — S02600A Fracture of unspecified part of body of mandible, initial encounter for closed fracture: Principal | ICD-10-CM | POA: Insufficient documentation

## 2012-09-17 DIAGNOSIS — K0889 Other specified disorders of teeth and supporting structures: Secondary | ICD-10-CM | POA: Insufficient documentation

## 2012-09-17 DIAGNOSIS — S02640A Fracture of ramus of mandible, unspecified side, initial encounter for closed fracture: Secondary | ICD-10-CM | POA: Insufficient documentation

## 2012-09-17 DIAGNOSIS — S02620A Fracture of subcondylar process of mandible, unspecified side, initial encounter for closed fracture: Secondary | ICD-10-CM | POA: Insufficient documentation

## 2012-09-17 DIAGNOSIS — F17200 Nicotine dependence, unspecified, uncomplicated: Secondary | ICD-10-CM | POA: Insufficient documentation

## 2012-09-17 HISTORY — PX: ORIF MANDIBULAR FRACTURE: SHX2127

## 2012-09-17 SURGERY — OPEN REDUCTION INTERNAL FIXATION (ORIF) MANDIBULAR FRACTURE
Anesthesia: General | Site: Mouth | Wound class: Clean Contaminated

## 2012-09-17 MED ORDER — PROPOFOL 10 MG/ML IV BOLUS
INTRAVENOUS | Status: DC | PRN
  Administered 2012-09-17: 200 mg via INTRAVENOUS

## 2012-09-17 MED ORDER — HYDROMORPHONE HCL PF 1 MG/ML IJ SOLN
0.2500 mg | INTRAMUSCULAR | Status: DC | PRN
  Administered 2012-09-17 (×3): 0.5 mg via INTRAVENOUS

## 2012-09-17 MED ORDER — MORPHINE SULFATE 2 MG/ML IJ SOLN
2.0000 mg | INTRAMUSCULAR | Status: DC | PRN
  Administered 2012-09-17: 4 mg via INTRAVENOUS
  Administered 2012-09-17: 2 mg via INTRAVENOUS
  Administered 2012-09-18: 4 mg via INTRAVENOUS
  Filled 2012-09-17 (×2): qty 2

## 2012-09-17 MED ORDER — KCL IN DEXTROSE-NACL 20-5-0.45 MEQ/L-%-% IV SOLN
INTRAVENOUS | Status: AC
  Filled 2012-09-17: qty 1000

## 2012-09-17 MED ORDER — LACTATED RINGERS IV SOLN
INTRAVENOUS | Status: DC | PRN
  Administered 2012-09-17 (×3): via INTRAVENOUS

## 2012-09-17 MED ORDER — MORPHINE SULFATE 2 MG/ML IJ SOLN
INTRAMUSCULAR | Status: AC
  Filled 2012-09-17: qty 1

## 2012-09-17 MED ORDER — ONDANSETRON 4 MG PO TBDP
8.0000 mg | ORAL_TABLET | Freq: Once | ORAL | Status: AC
  Administered 2012-09-17: 8 mg via ORAL
  Filled 2012-09-17: qty 2

## 2012-09-17 MED ORDER — SODIUM CHLORIDE 0.9 % IV SOLN
INTRAVENOUS | Status: DC
  Administered 2012-09-17: 06:00:00 via INTRAVENOUS

## 2012-09-17 MED ORDER — FENTANYL CITRATE 0.05 MG/ML IJ SOLN
INTRAMUSCULAR | Status: DC | PRN
  Administered 2012-09-17: 150 ug via INTRAVENOUS
  Administered 2012-09-17: 100 ug via INTRAVENOUS

## 2012-09-17 MED ORDER — CLINDAMYCIN PHOSPHATE 600 MG/50ML IV SOLN
600.0000 mg | Freq: Once | INTRAVENOUS | Status: AC
  Administered 2012-09-17 (×2): 600 mg via INTRAVENOUS
  Filled 2012-09-17: qty 50

## 2012-09-17 MED ORDER — HYDROMORPHONE HCL PF 1 MG/ML IJ SOLN
INTRAMUSCULAR | Status: AC
  Administered 2012-09-17: 0.5 mg via INTRAVENOUS
  Filled 2012-09-17: qty 1

## 2012-09-17 MED ORDER — OXYCODONE HCL 5 MG/5ML PO SOLN: 5.0000 mg | Freq: Once | ORAL | Status: DC | PRN

## 2012-09-17 MED ORDER — ONDANSETRON HCL 4 MG/2ML IJ SOLN
4.0000 mg | Freq: Once | INTRAMUSCULAR | Status: AC
  Administered 2012-09-17: 4 mg via INTRAVENOUS
  Filled 2012-09-17: qty 2

## 2012-09-17 MED ORDER — ONDANSETRON HCL 4 MG/2ML IJ SOLN: 4.0000 mg | INTRAMUSCULAR | Status: DC | PRN

## 2012-09-17 MED ORDER — ONDANSETRON HCL 4 MG PO TABS: 4.0000 mg | ORAL_TABLET | ORAL | Status: DC | PRN

## 2012-09-17 MED ORDER — METOCLOPRAMIDE HCL 5 MG/ML IJ SOLN: 10.0000 mg | Freq: Once | INTRAMUSCULAR | Status: DC | PRN

## 2012-09-17 MED ORDER — HYDROMORPHONE HCL PF 1 MG/ML IJ SOLN
1.0000 mg | INTRAMUSCULAR | Status: AC
  Administered 2012-09-17: 1 mg via INTRAVENOUS
  Filled 2012-09-17: qty 1

## 2012-09-17 MED ORDER — HYDROCODONE-ACETAMINOPHEN 7.5-500 MG/15ML PO SOLN
10.0000 mL | ORAL | Status: DC | PRN
  Administered 2012-09-18: 15 mL via ORAL
  Filled 2012-09-17: qty 15

## 2012-09-17 MED ORDER — MIDAZOLAM HCL 5 MG/5ML IJ SOLN
INTRAMUSCULAR | Status: DC | PRN
  Administered 2012-09-17: 2 mg via INTRAVENOUS

## 2012-09-17 MED ORDER — ONDANSETRON HCL 4 MG/2ML IJ SOLN
INTRAMUSCULAR | Status: DC | PRN
  Administered 2012-09-17: 4 mg via INTRAVENOUS

## 2012-09-17 MED ORDER — LIDOCAINE-EPINEPHRINE 1 %-1:100000 IJ SOLN
INTRAMUSCULAR | Status: DC | PRN
  Administered 2012-09-17: 7 mL

## 2012-09-17 MED ORDER — KCL IN DEXTROSE-NACL 20-5-0.45 MEQ/L-%-% IV SOLN
INTRAVENOUS | Status: DC
  Administered 2012-09-17 – 2012-09-18 (×2): via INTRAVENOUS
  Filled 2012-09-17 (×4): qty 1000

## 2012-09-17 MED ORDER — HYDROMORPHONE HCL PF 2 MG/ML IJ SOLN
2.0000 mg | Freq: Once | INTRAMUSCULAR | Status: AC
  Administered 2012-09-17: 2 mg via INTRAMUSCULAR
  Filled 2012-09-17: qty 1

## 2012-09-17 MED ORDER — 0.9 % SODIUM CHLORIDE (POUR BTL) OPTIME
TOPICAL | Status: DC | PRN
  Administered 2012-09-17: 100 mL

## 2012-09-17 MED ORDER — LIDOCAINE HCL (CARDIAC) 20 MG/ML IV SOLN
INTRAVENOUS | Status: DC | PRN
  Administered 2012-09-17: 80 mg via INTRAVENOUS

## 2012-09-17 MED ORDER — DEXAMETHASONE SODIUM PHOSPHATE 4 MG/ML IJ SOLN
INTRAMUSCULAR | Status: DC | PRN
  Administered 2012-09-17: 8 mg via INTRAVENOUS

## 2012-09-17 MED ORDER — CLINDAMYCIN PHOSPHATE 600 MG/50ML IV SOLN
600.0000 mg | Freq: Three times a day (TID) | INTRAVENOUS | Status: DC
  Administered 2012-09-17 – 2012-09-18 (×2): 600 mg via INTRAVENOUS
  Filled 2012-09-17 (×4): qty 50

## 2012-09-17 MED ORDER — SUCCINYLCHOLINE CHLORIDE 20 MG/ML IJ SOLN
INTRAMUSCULAR | Status: DC | PRN
  Administered 2012-09-17: 140 mg via INTRAVENOUS

## 2012-09-17 MED ORDER — OXYCODONE HCL 5 MG PO TABS: 5.0000 mg | ORAL_TABLET | Freq: Once | ORAL | Status: DC | PRN

## 2012-09-17 SURGICAL SUPPLY — 46 items
BIT DRILL TWIST 1.3X5 (BIT) ×1
BIT DRILL TWIST 1.3X5MM (BIT) ×1 IMPLANT
BLADE SURG 15 STRL LF DISP TIS (BLADE) ×1 IMPLANT
BLADE SURG 15 STRL SS (BLADE) ×1
BLADE SURG ROTATE 9660 (MISCELLANEOUS) IMPLANT
CANISTER SUCTION 2500CC (MISCELLANEOUS) ×2 IMPLANT
CLEANER TIP ELECTROSURG 2X2 (MISCELLANEOUS) ×2 IMPLANT
CLOTH BEACON ORANGE TIMEOUT ST (SAFETY) ×2 IMPLANT
COVER SURGICAL LIGHT HANDLE (MISCELLANEOUS) ×2 IMPLANT
CRADLE DONUT ADULT HEAD (MISCELLANEOUS) IMPLANT
DECANTER SPIKE VIAL GLASS SM (MISCELLANEOUS) ×2 IMPLANT
DRILL BIT TWIST 1.3X5MM (BIT) ×1
ELECT COATED BLADE 2.86 ST (ELECTRODE) ×2 IMPLANT
ELECT REM PT RETURN 9FT ADLT (ELECTROSURGICAL) ×2
ELECTRODE REM PT RTRN 9FT ADLT (ELECTROSURGICAL) ×1 IMPLANT
GLOVE BIO SURGEON STRL SZ7.5 (GLOVE) ×2 IMPLANT
GOWN STRL NON-REIN LRG LVL3 (GOWN DISPOSABLE) ×4 IMPLANT
KIT BASIN OR (CUSTOM PROCEDURE TRAY) ×2 IMPLANT
KIT ROOM TURNOVER OR (KITS) ×2 IMPLANT
NEEDLE 27GAX1X1/2 (NEEDLE) ×2 IMPLANT
NS IRRIG 1000ML POUR BTL (IV SOLUTION) ×2 IMPLANT
PAD ARMBOARD 7.5X6 YLW CONV (MISCELLANEOUS) ×4 IMPLANT
PENCIL BUTTON HOLSTER BLD 10FT (ELECTRODE) ×2 IMPLANT
PLATE 8 H STRAIGHT MID FACE (Plate) ×2 IMPLANT
PLATE MNDBLE 2.0 COMP 4H (Plate) ×2 IMPLANT
SCISSORS WIRE ANG 4 3/4 DISP (INSTRUMENTS) ×2 IMPLANT
SCREW BONE CROSS PIN 2.0X12MM (Screw) ×4 IMPLANT
SCREW BONE CROSS PIN 2.0X14 (Screw) ×4 IMPLANT
SCREW BONE CROSS PIN 2.0X16MM (Screw) ×4 IMPLANT
SCREW MIDFACE 1.7X4MM SLF TAP (Screw) ×8 IMPLANT
SUT ETHILON 4 0 CL P 3 (SUTURE) IMPLANT
SUT MON AB 3-0 SH 27 (SUTURE) ×2
SUT MON AB 3-0 SH27 (SUTURE) ×2 IMPLANT
SUT PROLENE 6 0 PC 1 (SUTURE) IMPLANT
SUT STEEL 0 (SUTURE)
SUT STEEL 0 18XMFL TIE 17 (SUTURE) IMPLANT
SUT STEEL 1 (SUTURE) ×2 IMPLANT
SUT STEEL 2 (SUTURE) IMPLANT
SUT STEEL 4 (SUTURE) ×2 IMPLANT
SUT VICRYL 4-0 PS2 18IN ABS (SUTURE) ×2 IMPLANT
SYR CONTROL 10ML LL (SYRINGE) ×2 IMPLANT
TOWEL OR 17X24 6PK STRL BLUE (TOWEL DISPOSABLE) ×2 IMPLANT
TOWEL OR 17X26 10 PK STRL BLUE (TOWEL DISPOSABLE) ×2 IMPLANT
TRAY ENT MC OR (CUSTOM PROCEDURE TRAY) ×2 IMPLANT
TRAY FOLEY CATH 14FRSI W/METER (CATHETERS) IMPLANT
WATER STERILE IRR 1000ML POUR (IV SOLUTION) IMPLANT
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
## 2012-09-17 NOTE — Brief Op Note (Signed)
09/17/2012  3:57 PM  PATIENT:  Jesson Xxxadam  21 y.o. male  PRE-OPERATIVE DIAGNOSIS:  Right body and left subcondylar mandible fracture  POST-OPERATIVE DIAGNOSIS:  same  PROCEDURE:  Procedure(s) (LRB) with comments: OPEN REDUCTION INTERNAL FIXATION (ORIF) MANDIBULAR FRACTURE (N/A) - orif mandible fx and MMF  SURGEON:  Surgeon(s) and Role:    * Christia Reading, MD - Primary  PHYSICIAN ASSISTANT:   ASSISTANTS: none   ANESTHESIA:   general  EBL:  Total I/O In: 2000 [I.V.:2000] Out: -   BLOOD ADMINISTERED:none  DRAINS: none   LOCAL MEDICATIONS USED:  LIDOCAINE   SPECIMEN:  No Specimen  DISPOSITION OF SPECIMEN:  N/A  COUNTS:  YES  TOURNIQUET:  * No tourniquets in log *  DICTATION: .Other Dictation: Dictation Number [id number]  PLAN OF CARE: Admit for overnight observation  PATIENT DISPOSITION:  PACU - hemodynamically stable.   Delay start of Pharmacological VTE agent (>24hrs) due to surgical blood loss or risk of bleeding: no
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
## 2012-09-17 NOTE — Anesthesia Procedure Notes (Signed)
Procedure Name: Intubation Date/Time: 09/17/2012 2:46 PM Performed by: Alanda Amass A Pre-anesthesia Checklist: Patient identified, Timeout performed, Emergency Drugs available, Suction available and Patient being monitored Patient Re-evaluated:Patient Re-evaluated prior to inductionOxygen Delivery Method: Circle system utilized Preoxygenation: Pre-oxygenation with 100% oxygen Intubation Type: IV induction, Rapid sequence and Cricoid Pressure applied Ventilation: Nasal airway inserted- appropriate to patient size Laryngoscope Size: Mac and 3 Grade View: Grade I Nasal Tubes: Right, Nasal prep performed, Nasal Rae and Magill forceps- large, utilized Tube size: 7.0 mm Number of attempts: 1 Placement Confirmation: ETT inserted through vocal cords under direct vision,  breath sounds checked- equal and bilateral and positive ETCO2 Secured at: 26 cm Tube secured with: Tape Dental Injury: Teeth and Oropharynx as per pre-operative assessment

## 2012-09-17 NOTE — Progress Notes (Signed)
Patient transferred from PACU to 1O10.  Alert and oriented.  Complaining of jaw pain.  Oriented to room and call light.  Father at bedside.  Wire cutters at head of bed.  O2 sat. 100% on O2 2lL.  HOB elevated 30 degrees.  Suction set up at bedside with hand held suction.  Will continue to monitor.

## 2012-09-17 NOTE — ED Notes (Addendum)
Pt has visible swelling to the left cheek and loose tooth on inside of lower left jaw. Pt is currently resting
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
## 2012-09-17 NOTE — Preoperative (Signed)
Beta Blockers   Reason not to administer Beta Blockers:Not Applicable 

## 2012-09-17 NOTE — Transfer of Care (Signed)
Immediate Anesthesia Transfer of Care Note  Patient: Norman Perez  Procedure(s) Performed: Procedure(s) (LRB) with comments: OPEN REDUCTION INTERNAL FIXATION (ORIF) MANDIBULAR FRACTURE (N/A) - orif mandible fx and MMF  Patient Location: PACU  Anesthesia Type:General  Level of Consciousness: awake  Airway & Oxygen Therapy: Patient Spontanous Breathing  Post-op Assessment: Report given to PACU RN, Post -op Vital signs reviewed and stable and wire cutters to PACU nurse at bedside  Post vital signs: Reviewed and stable  Complications: No apparent anesthesia complications

## 2012-09-17 NOTE — Anesthesia Preprocedure Evaluation (Signed)
Anesthesia Evaluation  Patient identified by MRN, date of birth, ID band Patient awake    Reviewed: Allergy & Precautions, H&P , NPO status , Patient's Chart, lab work & pertinent test results, reviewed documented beta blocker date and time   Airway Mallampati: II TM Distance: >3 FB Neck ROM: full    Dental   Pulmonary Current Smoker,  breath sounds clear to auscultation        Cardiovascular negative cardio ROS  Rhythm:regular     Neuro/Psych negative neurological ROS  negative psych ROS   GI/Hepatic negative GI ROS, Neg liver ROS,   Endo/Other  negative endocrine ROS  Renal/GU negative Renal ROS  negative genitourinary   Musculoskeletal   Abdominal   Peds  Hematology negative hematology ROS (+)   Anesthesia Other Findings See surgeon's H&P   Reproductive/Obstetrics negative OB ROS                           Anesthesia Physical Anesthesia Plan  ASA: II and emergent  Anesthesia Plan: General   Post-op Pain Management:    Induction: Intravenous, Rapid sequence and Cricoid pressure planned  Airway Management Planned: Nasal ETT  Additional Equipment:   Intra-op Plan:   Post-operative Plan: Extubation in OR  Informed Consent: I have reviewed the patients History and Physical, chart, labs and discussed the procedure including the risks, benefits and alternatives for the proposed anesthesia with the patient or authorized representative who has indicated his/her understanding and acceptance.   Dental Advisory Given  Plan Discussed with: CRNA and Surgeon  Anesthesia Plan Comments:         Anesthesia Quick Evaluation

## 2012-09-17 NOTE — Anesthesia Postprocedure Evaluation (Signed)
Anesthesia Post Note  Patient: Norman Perez  Procedure(s) Performed: Procedure(s) (LRB): OPEN REDUCTION INTERNAL FIXATION (ORIF) MANDIBULAR FRACTURE (N/A)  Anesthesia type: General  Patient location: PACU  Post pain: Pain level controlled  Post assessment: Patient's Cardiovascular Status Stable  Last Vitals:  Filed Vitals:   09/17/12 1620  BP: 165/101  Pulse: 116  Temp: 36.7 C  Resp: 19    Post vital signs: Reviewed and stable  Level of consciousness: alert  Complications: No apparent anesthesia complications

## 2012-09-18 MED ORDER — HYDROCODONE-ACETAMINOPHEN 7.5-500 MG/15ML PO SOLN: 10.0000 mL | ORAL | Status: DC | PRN

## 2012-09-18 MED FILL — Bacitracin Oint 500 Unit/GM: CUTANEOUS | Qty: 1 | Status: AC

## 2012-09-18 MED FILL — Oxymetazoline HCl Nasal Soln 0.05%: NASAL | Qty: 15 | Status: AC

## 2012-09-18 NOTE — ED Provider Notes (Signed)
Medical screening examination/treatment/procedure(s) were conducted as a shared visit with non-physician practitioner(s) and myself.  I personally evaluated the patient during the encounter  Flint Melter, MD 09/18/12 2342

## 2012-09-18 NOTE — Progress Notes (Signed)
Patient discharged to home with family.  Discharge teaching completed including follow up care, medications, signs and symptoms of infection, and diet.  Wire cutters sent home with patient.  Patient and father instructed on use.  Patient and family verbalize  Understanding with no further questions.  Vital signs stable, no complaints of nausea, pain controlled on PO pain med.  Discharged per wheelchair with father.
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
## 2012-09-19 ENCOUNTER — Encounter (HOSPITAL_COMMUNITY): Payer: Self-pay | Admitting: Otolaryngology

## 2012-09-25 ENCOUNTER — Emergency Department (HOSPITAL_COMMUNITY)
Admission: EM | Admit: 2012-09-25 | Discharge: 2012-09-25 | Disposition: A | Discharge status: Home / Self Care | Payer: Self-pay | Attending: Emergency Medicine | Admitting: Emergency Medicine

## 2012-09-25 ENCOUNTER — Encounter (HOSPITAL_COMMUNITY): Payer: Self-pay | Admitting: Nurse Practitioner

## 2012-09-25 DIAGNOSIS — Z4802 Encounter for removal of sutures: Secondary | ICD-10-CM

## 2012-09-25 DIAGNOSIS — F17200 Nicotine dependence, unspecified, uncomplicated: Secondary | ICD-10-CM | POA: Insufficient documentation

## 2012-09-25 DIAGNOSIS — Z8781 Personal history of (healed) traumatic fracture: Secondary | ICD-10-CM | POA: Insufficient documentation

## 2012-09-25 NOTE — ED Notes (Signed)
Paged ortho 

## 2012-09-25 NOTE — ED Provider Notes (Signed)
History    This chart was scribed for Norman Quarry, MD, MD by Smitty Pluck, ED Scribe. The patient was seen in room TR09C and the patient's care was started at 1:46PM.    CSN: [id number]  Arrival date & time 09/25/12  1258      Chief Complaint  Patient presents with  . Suture / Staple Removal    (Consider location/radiation/quality/duration/timing/severity/associated sxs/prior treatment) Patient is a 21 y.o. male presenting with suture removal. The history is provided by the patient. No language interpreter was used.  Suture / Staple Removal  The sutures were placed 7 to 10 days ago. There has been no drainage from the wound. There is no redness present. The swelling has improved. The pain has improved.   Norman Perez is a 21 y.o. male who presents to the Emergency Department due to suture removal from right hand. He reports having sutures placed 09/15/12. The laceration was caused by glass. He denies fever, chills, nausea, vomiting and any other pain.  History reviewed. No pertinent past medical history.  Past Surgical History  Procedure Date  . Hand surgery   . Orif mandibular fracture 09/17/2012    Procedure: OPEN REDUCTION INTERNAL FIXATION (ORIF) MANDIBULAR FRACTURE;  Surgeon: Christia Reading, MD;  Location: [facility] OR;  Service: ENT;  Laterality: N/A;  orif mandible fx and MMF    History reviewed. No pertinent family history.  History  Substance Use Topics  . Smoking status: Current Every Day Smoker -- 0.5 packs/day for 1 years  . Smokeless tobacco: Not on file  . Alcohol Use: Yes     Comment: occasional      Review of Systems  Constitutional: Negative for fever and chills.  Respiratory: Negative for shortness of breath.   Gastrointestinal: Negative for nausea and vomiting.  Skin: Positive for wound (healing laceration on right hand).  Neurological: Negative for weakness.  All other systems reviewed and are negative.    Allergies  Review of patient's allergies  indicates no known allergies.  Home Medications   Current Outpatient Rx  Name  Route  Sig  Dispense  Refill  . BACITRACIN ZINC 500 UNIT/GM EX OINT   Topical   Apply topically 2 (two) times daily.   120 g   0   . HYDROCODONE-ACETAMINOPHEN 7.5-500 MG/15ML PO SOLN   Oral   Take 10-15 mLs by mouth every 4 (four) hours as needed.   120 mL   1     BP 115/73  Pulse 86  Temp 97.7 F (36.5 C) (Axillary)  Resp 18  SpO2 99%  Physical Exam  Nursing note and vitals reviewed. Constitutional: He is oriented to person, place, and time. He appears well-developed and well-nourished. No distress.  HENT:  Head: Normocephalic and atraumatic.  Eyes: EOM are normal.  Neck: Neck supple. No tracheal deviation present.  Cardiovascular: Normal rate.   Pulmonary/Chest: Effort normal. No respiratory distress.  Musculoskeletal: Normal range of motion.       Right hand has mild swelling No signs of infection   Neurological: He is alert and oriented to person, place, and time.  Skin: Skin is warm and dry.  Psychiatric: He has a normal mood and affect. His behavior is normal.    ED Course  Procedures (including critical care time) DIAGNOSTIC STUDIES: Oxygen Saturation is 99% on room air, normal by my interpretation.    COORDINATION OF CARE: 1:48 PM Discussed ED treatment with pt     Labs Reviewed - No data to  display No results found.   No diagnosis found.    MDM  I personally performed the services described in this documentation, which was scribed in my presence. The recorded information has been reviewed and considered.   Norman Quarry, MD 09/30/12 [phone]

## 2012-09-25 NOTE — ED Notes (Signed)
Pt has sutures on right hand that need to be removed; no signs of swelling, redness, drainage, or blood noted to site; site clean and dry; pt denies pain.

## 2012-09-25 NOTE — ED Notes (Signed)
Ortho at bedside.

## 2012-09-25 NOTE — Progress Notes (Signed)
Orthopedic Tech Progress Note Patient Details:  Norman Perez 17-Jan-1991 [id number]  Ortho Devices Type of Ortho Device: Velcro wrist splint Ortho Device/Splint Location: right wrist splint Ortho Device/Splint Interventions: Application   Cammer, Mickie Bail 09/25/2012, 2:40 PM

## 2012-09-25 NOTE — ED Notes (Signed)
Pt here for suture removal from R posterior hand placed here 11/22. No s/s of infection, appears to  Be healing well, pt denies any complaints

## 2012-10-25 DIAGNOSIS — Z969 Presence of functional implant, unspecified: Secondary | ICD-10-CM

## 2012-10-25 DIAGNOSIS — M2652 Limited mandibular range of motion: Secondary | ICD-10-CM

## 2012-10-25 HISTORY — DX: Presence of functional implant, unspecified: Z96.9

## 2012-10-25 HISTORY — DX: Limited mandibular range of motion: M26.52

## 2012-11-03 ENCOUNTER — Encounter (HOSPITAL_BASED_OUTPATIENT_CLINIC_OR_DEPARTMENT_OTHER): Payer: Self-pay | Admitting: *Deleted

## 2012-11-10 ENCOUNTER — Encounter (HOSPITAL_BASED_OUTPATIENT_CLINIC_OR_DEPARTMENT_OTHER): Payer: Self-pay | Admitting: *Deleted

## 2012-11-10 ENCOUNTER — Ambulatory Visit (HOSPITAL_BASED_OUTPATIENT_CLINIC_OR_DEPARTMENT_OTHER)
Admission: RE | Admit: 2012-11-10 | Discharge: 2012-11-10 | Disposition: A | Discharge status: Home / Self Care | Source: Ambulatory Visit | Attending: Otolaryngology | Admitting: Otolaryngology

## 2012-11-10 ENCOUNTER — Encounter (HOSPITAL_BASED_OUTPATIENT_CLINIC_OR_DEPARTMENT_OTHER)
Admission: RE | Disposition: A | Discharge status: Home / Self Care | Payer: Self-pay | Source: Ambulatory Visit | Attending: Otolaryngology

## 2012-11-10 ENCOUNTER — Ambulatory Visit (HOSPITAL_BASED_OUTPATIENT_CLINIC_OR_DEPARTMENT_OTHER): Admitting: Anesthesiology

## 2012-11-10 ENCOUNTER — Encounter (HOSPITAL_BASED_OUTPATIENT_CLINIC_OR_DEPARTMENT_OTHER): Payer: Self-pay | Admitting: Anesthesiology

## 2012-11-10 DIAGNOSIS — Z472 Encounter for removal of internal fixation device: Secondary | ICD-10-CM | POA: Insufficient documentation

## 2012-11-10 DIAGNOSIS — Z8614 Personal history of Methicillin resistant Staphylococcus aureus infection: Secondary | ICD-10-CM | POA: Insufficient documentation

## 2012-11-10 DIAGNOSIS — S02609A Fracture of mandible, unspecified, initial encounter for closed fracture: Secondary | ICD-10-CM

## 2012-11-10 HISTORY — PX: MANDIBULAR HARDWARE REMOVAL: SHX5205

## 2012-11-10 HISTORY — DX: Personal history of Methicillin resistant Staphylococcus aureus infection: Z86.14

## 2012-11-10 HISTORY — DX: Presence of functional implant, unspecified: Z96.9

## 2012-11-10 HISTORY — DX: Limited mandibular range of motion: M26.52

## 2012-11-10 SURGERY — REMOVAL, HARDWARE, MANDIBLE
Anesthesia: General | Site: Mouth | Laterality: Bilateral | Wound class: Clean Contaminated

## 2012-11-10 MED ORDER — ONDANSETRON HCL 4 MG/2ML IJ SOLN: 4.0000 mg | Freq: Four times a day (QID) | INTRAMUSCULAR | Status: DC | PRN

## 2012-11-10 MED ORDER — FENTANYL CITRATE 0.05 MG/ML IJ SOLN
25.0000 ug | INTRAMUSCULAR | Status: DC | PRN
  Administered 2012-11-10 (×2): 25 ug via INTRAVENOUS

## 2012-11-10 MED ORDER — OXYCODONE HCL 5 MG PO TABS: 5.0000 mg | ORAL_TABLET | Freq: Once | ORAL | Status: DC | PRN

## 2012-11-10 MED ORDER — FENTANYL CITRATE 0.05 MG/ML IJ SOLN
INTRAMUSCULAR | Status: DC | PRN
  Administered 2012-11-10: 50 ug via INTRAVENOUS
  Administered 2012-11-10: 100 ug via INTRAVENOUS
  Administered 2012-11-10: 25 ug via INTRAVENOUS

## 2012-11-10 MED ORDER — OXYCODONE HCL 5 MG/5ML PO SOLN: 5.0000 mg | Freq: Once | ORAL | Status: DC | PRN

## 2012-11-10 MED ORDER — FENTANYL CITRATE 0.05 MG/ML IJ SOLN: 50.0000 ug | INTRAMUSCULAR | Status: DC | PRN

## 2012-11-10 MED ORDER — DEXAMETHASONE SODIUM PHOSPHATE 4 MG/ML IJ SOLN
INTRAMUSCULAR | Status: DC | PRN
  Administered 2012-11-10: 10 mg via INTRAVENOUS

## 2012-11-10 MED ORDER — LIDOCAINE HCL (CARDIAC) 20 MG/ML IV SOLN
INTRAVENOUS | Status: DC | PRN
  Administered 2012-11-10: 60 mg via INTRAVENOUS

## 2012-11-10 MED ORDER — PROPOFOL 10 MG/ML IV BOLUS
INTRAVENOUS | Status: DC | PRN
  Administered 2012-11-10: 200 mg via INTRAVENOUS

## 2012-11-10 MED ORDER — LIDOCAINE-EPINEPHRINE 1 %-1:100000 IJ SOLN
INTRAMUSCULAR | Status: DC | PRN
  Administered 2012-11-10: 4 mL

## 2012-11-10 MED ORDER — MIDAZOLAM HCL 5 MG/5ML IJ SOLN
INTRAMUSCULAR | Status: DC | PRN
  Administered 2012-11-10: 2 mg via INTRAVENOUS

## 2012-11-10 MED ORDER — MIDAZOLAM HCL 2 MG/2ML IJ SOLN: 1.0000 mg | INTRAMUSCULAR | Status: DC | PRN

## 2012-11-10 MED ORDER — HYDROCODONE-ACETAMINOPHEN 5-325 MG PO TABS: 2.0000 | ORAL_TABLET | Freq: Four times a day (QID) | ORAL | Status: DC | PRN

## 2012-11-10 MED ORDER — LACTATED RINGERS IV SOLN
INTRAVENOUS | Status: DC
  Administered 2012-11-10: 10:00:00 via INTRAVENOUS

## 2012-11-10 MED ORDER — ONDANSETRON HCL 4 MG/2ML IJ SOLN
INTRAMUSCULAR | Status: DC | PRN
  Administered 2012-11-10: 4 mg via INTRAVENOUS

## 2012-11-10 SURGICAL SUPPLY — 30 items
BLADE SURG 15 STRL LF DISP TIS (BLADE) ×1 IMPLANT
BLADE SURG 15 STRL SS (BLADE) ×1
CANISTER SUCTION 1200CC (MISCELLANEOUS) ×2 IMPLANT
CLOTH BEACON ORANGE TIMEOUT ST (SAFETY) ×2 IMPLANT
COVER MAYO STAND STRL (DRAPES) IMPLANT
DECANTER SPIKE VIAL GLASS SM (MISCELLANEOUS) ×2 IMPLANT
ELECT COATED BLADE 2.86 ST (ELECTRODE) ×2 IMPLANT
ELECT REM PT RETURN 9FT ADLT (ELECTROSURGICAL) ×2
ELECTRODE REM PT RTRN 9FT ADLT (ELECTROSURGICAL) ×1 IMPLANT
GAUZE SPONGE 4X4 12PLY STRL LF (GAUZE/BANDAGES/DRESSINGS) ×4 IMPLANT
GAUZE SPONGE 4X4 16PLY XRAY LF (GAUZE/BANDAGES/DRESSINGS) IMPLANT
GLOVE BIO SURGEON STRL SZ 6.5 (GLOVE) ×2 IMPLANT
GLOVE BIO SURGEON STRL SZ7.5 (GLOVE) ×2 IMPLANT
GLOVE BIOGEL PI IND STRL 7.0 (GLOVE) ×1 IMPLANT
GLOVE BIOGEL PI INDICATOR 7.0 (GLOVE) ×1
GOWN PREVENTION PLUS XLARGE (GOWN DISPOSABLE) ×2 IMPLANT
NEEDLE 27GAX1X1/2 (NEEDLE) ×2 IMPLANT
NS IRRIG 1000ML POUR BTL (IV SOLUTION) ×2 IMPLANT
PACK BASIN DAY SURGERY FS (CUSTOM PROCEDURE TRAY) IMPLANT
PENCIL BUTTON HOLSTER BLD 10FT (ELECTRODE) ×2 IMPLANT
SCISSORS WIRE ANG 4 3/4 DISP (INSTRUMENTS) IMPLANT
SHEET MEDIUM DRAPE 40X70 STRL (DRAPES) ×2 IMPLANT
SUT MNCRL AB 3-0 PS2 18 (SUTURE) ×4 IMPLANT
SYR 20CC LL (SYRINGE) ×2 IMPLANT
SYR CONTROL 10ML LL (SYRINGE) ×2 IMPLANT
TOWEL OR 17X24 6PK STRL BLUE (TOWEL DISPOSABLE) ×2 IMPLANT
TRAY DSU PREP LF (CUSTOM PROCEDURE TRAY) IMPLANT
TUBE CONNECTING 20X1/4 (TUBING) ×2 IMPLANT
WATER STERILE IRR 1000ML POUR (IV SOLUTION) IMPLANT
YANKAUER SUCT BULB TIP NO VENT (SUCTIONS) ×2 IMPLANT

## 2012-11-10 NOTE — Brief Op Note (Signed)
11/10/2012  11:16 AM  PATIENT:  Norman Perez  22 y.o. male  PRE-OPERATIVE DIAGNOSIS:  mandibular fracture   POST-OPERATIVE DIAGNOSIS:  mandibular fracture   PROCEDURE:  Procedure(s) (LRB) with comments: MANDIBULAR HARDWARE REMOVAL (Bilateral)  SURGEON:  Surgeon(s) and Role:    * Christia Reading, MD - Primary  PHYSICIAN ASSISTANT:   ASSISTANTS: none   ANESTHESIA:   general  EBL:  Total I/O In: 800 [I.V.:800] Out: -   BLOOD ADMINISTERED:none  DRAINS: none   LOCAL MEDICATIONS USED:  LIDOCAINE   SPECIMEN:  No Specimen  DISPOSITION OF SPECIMEN:  N/A  COUNTS:  YES  TOURNIQUET:  * No tourniquets in log *  DICTATION: .Other Dictation: Dictation Number [id number]  PLAN OF CARE: Discharge to home after PACU  PATIENT DISPOSITION:  PACU - hemodynamically stable.   Delay start of Pharmacological VTE agent (>24hrs) due to surgical blood loss or risk of bleeding: no

## 2012-11-10 NOTE — Anesthesia Postprocedure Evaluation (Signed)
Anesthesia Post Note  Patient: Norman Perez  Procedure(s) Performed: Procedure(s) (LRB): MANDIBULAR HARDWARE REMOVAL (Bilateral)  Anesthesia type: General  Patient location: PACU  Post pain: Pain level controlled and Adequate analgesia  Post assessment: Post-op Vital signs reviewed, Patient's Cardiovascular Status Stable, Respiratory Function Stable, Patent Airway and Pain level controlled  Last Vitals:  Filed Vitals:   11/10/12 1235  BP: 125/90  Pulse: 65  Temp: 37.3 C  Resp: 16    Post vital signs: Reviewed and stable  Level of consciousness: awake, alert  and oriented  Complications: No apparent anesthesia complications

## 2012-11-10 NOTE — Transfer of Care (Signed)
Immediate Anesthesia Transfer of Care Note  Patient: Norman Perez  Procedure(s) Performed: Procedure(s) (LRB) with comments: MANDIBULAR HARDWARE REMOVAL (Bilateral)  Patient Location: PACU  Anesthesia Type:General  Level of Consciousness: awake  Airway & Oxygen Therapy: Patient Spontanous Breathing and Patient connected to face mask oxygen  Post-op Assessment: Report given to PACU RN and Post -op Vital signs reviewed and stable  Post vital signs: Reviewed and stable  Complications: No apparent anesthesia complications

## 2012-11-10 NOTE — Anesthesia Preprocedure Evaluation (Signed)
Anesthesia Evaluation  Patient identified by MRN, date of birth, ID band Patient awake    Reviewed: Allergy & Precautions, H&P , NPO status , Patient's Chart, lab work & pertinent test results  Airway       Dental   Pulmonary Current Smoker,          Cardiovascular     Neuro/Psych    GI/Hepatic   Endo/Other    Renal/GU      Musculoskeletal   Abdominal   Peds  Hematology   Anesthesia Other Findings   Reproductive/Obstetrics                           Anesthesia Physical Anesthesia Plan  ASA: II  Anesthesia Plan: General   Post-op Pain Management:    Induction: Intravenous  Airway Management Planned: Oral ETT  Additional Equipment:   Intra-op Plan:   Post-operative Plan: Extubation in OR  Informed Consent:   Plan Discussed with: CRNA and Surgeon  Anesthesia Plan Comments:         Anesthesia Quick Evaluation

## 2012-11-10 NOTE — H&P (Signed)
Norman Perez is an 22 y.o. male.   Chief Complaint: mandibular fracture HPI: 22 year old with mandible fracture 09/17/12 treated with ORIF and MMF.  Has done well.  Wires were cut 10/30/12.  Still with good occlusion and no pain.  Has stretched muscles of mastication only passively thus far.  Past Medical History  Diagnosis Date  . History of MRSA infection 12/2010    right hand  . 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 Retained orthopedic hardware 10/2012    jaw    Past Surgical History  Procedure Date  . Finger debridement 01/08/2011    right long finger; I & D flexor tendon sheath  . Orif mandibular fracture 09/17/2012    Procedure: OPEN REDUCTION INTERNAL FIXATION (ORIF) MANDIBULAR FRACTURE;  Surgeon: Christia Reading, MD;  Location: [facility] OR;  Service: ENT;  Laterality: N/A;  orif mandible fx and MMF  . Trigger finger release 01/08/2011    right long finger    History reviewed. No pertinent family history. Social History:  reports that he has been smoking Cigarettes.  He has smoked for the past 6 years. He has never used smokeless tobacco. He reports that he drinks alcohol. He reports that he does not use illicit drugs.  Allergies: No Known Allergies  No prescriptions prior to admission    No results found for this or any previous visit (from the past 48 hour(s)). No results found.  Review of Systems  All other systems reviewed and are negative.    Blood pressure 125/87, pulse 68, temperature 98.6 F (37 C), temperature source Oral, resp. rate 16, height 6' (1.829 m), weight 64.864 kg (143 lb), SpO2 99.00%. Physical Exam  Constitutional: He is oriented to person, place, and time. He appears well-developed and well-nourished. No distress.  HENT:  Head: Normocephalic and atraumatic.  Right Ear: External ear normal.  Left Ear: External ear normal.  Nose: Nose normal.  Mouth/Throat: Oropharynx is clear and moist.       Normal occlusion.  No mandibular tenderness.    Eyes: Conjunctivae normal and EOM are normal. Pupils are equal, round, and reactive to light.  Neck: Normal range of motion. Neck supple.  Cardiovascular: Normal rate.   Respiratory: Effort normal.  GI:       Did not examine.  Genitourinary:       Did not examine.  Musculoskeletal: Normal range of motion.  Neurological: He is alert and oriented to person, place, and time. No cranial nerve deficit.  Skin: Skin is warm and dry.  Psychiatric: He has a normal mood and affect. His behavior is normal. Judgment and thought content normal.     Assessment/Plan Mandible fracture To OR for removal of MMF screws.  Sharronda Schweers 11/10/2012, 10:40 AM

## 2012-11-10 NOTE — Anesthesia Procedure Notes (Signed)
Procedure Name: LMA Insertion Performed by: Raychell Holcomb W Pre-anesthesia Checklist: Patient identified, Timeout performed, Emergency Drugs available, Suction available and Patient being monitored Patient Re-evaluated:Patient Re-evaluated prior to inductionOxygen Delivery Method: Circle system utilized Preoxygenation: Pre-oxygenation with 100% oxygen Intubation Type: IV induction Ventilation: Mask ventilation without difficulty LMA: LMA inserted LMA Size: 4.0 Number of attempts: 1 Placement Confirmation: breath sounds checked- equal and bilateral Tube secured with: Tape Dental Injury: Teeth and Oropharynx as per pre-operative assessment      

## 2012-11-13 ENCOUNTER — Encounter (HOSPITAL_BASED_OUTPATIENT_CLINIC_OR_DEPARTMENT_OTHER): Payer: Self-pay | Admitting: Otolaryngology

## 2012-11-13 NOTE — Op Note (Signed)
Norman Perez, Norman Perez NO.:  [id number]  MEDICAL RECORD NO.:  [id number]  LOCATION:                               FACILITY:  [facility]  PHYSICIAN:  Antony Contras, MD     DATE OF BIRTH:  [birth year]  DATE OF PROCEDURE:  11/10/2012 DATE OF DISCHARGE:  11/10/2012                              OPERATIVE REPORT   PREOPERATIVE DIAGNOSIS:  History of mandible fracture.  POSTOPERATIVE DIAGNOSIS:  History of mandible fracture.  PROCEDURE:  Removal of mandibular hardware.  SURGEON:  Excell Seltzer. Jenne Pane, MD  ANESTHESIA:  General LMA.  COMPLICATIONS:  None.  INDICATION:  The patient is a 22 year old African American male, who sustained a mandible fracture in November and was treated with ORIF and MMF.  He remained in wire fixation until October 30, 2012, when the wires were cut.  Now, without mandibular pain or malocclusion, he presents to the operating room for removal of the mandibular screws that were holding the wires.  FINDINGS:  The 4 screws were found in their normal position and were removed along with the associated wires.  DESCRIPTION PROCEDURE:  The patient was identified in the holding room and informed consent having been obtained including discussion of risks, benefits, alternatives, the patient was brought to the operative suite, put on the operative table in supine position.  Anesthesia was induced. The patient was intubated with an LMA without difficulty.  The eyes taped closed.  The gingivobuccal sulcus was injected with 1% lidocaine with 1:100,000 epinephrine superiorly and inferiorly at each screw site. Incisions over the screws were made with Bovie electrocautery and cautery was used to dissect through the soft tissues to the screw.  Each wire was removed with a needle driver and then each screw was removed using a screwdriver.  Each site was copiously irrigated with saline. Each site was closed with 3-0 Monocryl suture in a simple running fashion.  The  throat was suctioned.  The patient returned to anesthesia for wake up and was extubated to recovery in stable condition.     Antony Contras, MD    DDB/MEDQ  D:  11/10/2012  T:  11/11/2012  Job:  [id number]

## 2014-11-08 ENCOUNTER — Encounter (HOSPITAL_COMMUNITY): Payer: Self-pay | Admitting: Emergency Medicine

## 2014-11-08 ENCOUNTER — Emergency Department (HOSPITAL_COMMUNITY)
Admission: EM | Admit: 2014-11-08 | Discharge: 2014-11-08 | Disposition: A | Discharge status: Home / Self Care | Attending: Emergency Medicine | Admitting: Emergency Medicine

## 2014-11-08 DIAGNOSIS — Z87891 Personal history of nicotine dependence: Secondary | ICD-10-CM | POA: Insufficient documentation

## 2014-11-08 DIAGNOSIS — J00 Acute nasopharyngitis [common cold]: Secondary | ICD-10-CM | POA: Insufficient documentation

## 2014-11-08 DIAGNOSIS — Z8739 Personal history of other diseases of the musculoskeletal system and connective tissue: Secondary | ICD-10-CM | POA: Insufficient documentation

## 2014-11-08 DIAGNOSIS — IMO0001 Reserved for inherently not codable concepts without codable children: Secondary | ICD-10-CM

## 2014-11-08 DIAGNOSIS — Z8614 Personal history of Methicillin resistant Staphylococcus aureus infection: Secondary | ICD-10-CM | POA: Insufficient documentation

## 2014-11-08 DIAGNOSIS — Z0289 Encounter for other administrative examinations: Secondary | ICD-10-CM | POA: Insufficient documentation

## 2014-11-08 NOTE — ED Provider Notes (Signed)
CSN: [id number]     Arrival date & time 11/08/14  1030 History  This chart was scribed for non-physician practitioner, Eben Burowourtney A Forcucci, PA-C, working with Juliet RudeNathan R. Rubin PayorPickering, MD by Charline BillsEssence Howell, ED Scribe. This patient was seen in room TR05C/TR05C and the patient's care was started at 11:24 AM.   Chief Complaint  Patient presents with  . URI   The history is provided by the patient. No language interpreter was used.   HPI Comments:  Norman Perez is a 24 y.o. male who presents to the Emergency Department for a work note. Pt states that he took 3 days off of work for cold symptoms that have resolved. He denies generalized body aches, congestion, cough, SOB, fever, chills, nausea, vomiting.   No PCP  Past Medical History  Diagnosis Date  . History of MRSA infection 12/2010    right hand  . 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 Retained orthopedic hardware 10/2012    jaw   Past Surgical History  Procedure Laterality Date  . Finger debridement  01/08/2011    right long finger; I & D flexor tendon sheath  . Orif mandibular fracture  09/17/2012    Procedure: OPEN REDUCTION INTERNAL FIXATION (ORIF) MANDIBULAR FRACTURE;  Surgeon: Christia Readingwight Bates, MD;  [facility];  Service: ENT;  Laterality: N/A;  orif mandible fx and MMF  . Trigger finger release  01/08/2011    right long finger  . Mandibular hardware removal  11/10/2012    Procedure: MANDIBULAR HARDWARE REMOVAL;  Surgeon: Christia Readingwight Bates, MD;  Location: [facility];  Service: ENT;  Laterality: Bilateral;   No family history on file. History  Substance Use Topics  . Smoking status: Former Smoker -- 6 years    Types: Cigarettes    Quit date: 05/08/2013  . Smokeless tobacco: Never Used     Comment: 7-8 cig./day  . Alcohol Use: Yes     Comment: occasionally    Review of Systems  Constitutional: Negative for fever and chills.  HENT: Negative for congestion.   Respiratory: Negative for cough and shortness  of breath.   Gastrointestinal: Negative for nausea and vomiting.  Musculoskeletal: Negative for myalgias.  All other systems reviewed and are negative.  Allergies  Review of patient's allergies indicates no known allergies.  Home Medications   Prior to Admission medications   Medication Sig Start Date End Date Taking? Authorizing Provider  HYDROcodone-acetaminophen (NORCO/VICODIN) 5-325 MG per tablet Take 2 tablets by mouth every 6 (six) hours as needed for pain. 11/10/12   Christia Readingwight Bates, MD   Triage Vitals: BP 113/75 mmHg  Pulse 68  Temp(Src) 98.7 F (37.1 C) (Oral)  Resp 22  Ht 6\' 1"  (1.854 m)  Wt 170 lb (77.111 kg)  BMI 22.43 kg/m2 Physical Exam  Constitutional: He is oriented to person, place, and time. He appears well-developed and well-nourished. No distress.  HENT:  Head: Normocephalic and atraumatic.  Right Ear: Tympanic membrane normal.  Left Ear: Tympanic membrane normal.  Nose: Nose normal.  Mouth/Throat: Oropharynx is clear and moist. No oropharyngeal exudate.  Eyes: Conjunctivae and EOM are normal. Pupils are equal, round, and reactive to light.  Neck: Neck supple.  Cardiovascular: Normal rate, regular rhythm and normal heart sounds.  Exam reveals no gallop and no friction rub.   No murmur heard. Pulmonary/Chest: Effort normal and breath sounds normal. He has no wheezes. He has no rhonchi. He has no rales.  Musculoskeletal:  Normal range of motion.  Lymphadenopathy:    He has no cervical adenopathy.  Neurological: He is alert and oriented to person, place, and time.  Skin: Skin is warm and dry.  Psychiatric: He has a normal mood and affect. His behavior is normal.  Nursing note and vitals reviewed.  ED Course  Procedures (including critical care time) DIAGNOSTIC STUDIES: Oxygen Saturation is 100% on RA,by my interpretation.    COORDINATION OF CARE: 11:27 AM-Discussed treatment plan which includes work note with pt at bedside and pt agreed to plan.   Labs  Review Labs Reviewed - No data to display  Imaging Review No results found.   EKG Interpretation None      MDM   Final diagnoses:  Cold   Patient is a 24 year old male who presents to the emergency room for evaluation of cold and congestion. Patient has no complaints at this time. Physical exam is unremarkable. Patient is alert and nontoxic-appearing. Will provide work note for return.  I personally performed the services described in this documentation, which was scribed in my presence. The recorded information has been reviewed and is accurate.     Eben Burow, PA-C 11/08/14 1133  Nathan R. Rubin Payor, MD 11/08/14 [phone]

## 2014-11-08 NOTE — Discharge Instructions (Signed)
Cough, Adult  A cough is a reflex that helps clear your throat and airways. It can help heal the body or may be a reaction to an irritated airway. A cough may only last 2 or 3 weeks (acute) or may last more than 8 weeks (chronic).  CAUSES Acute cough:  Viral or bacterial infections. Chronic cough:  Infections.  Allergies.  Asthma.  Post-nasal drip.  Smoking.  Heartburn or acid reflux.  Some medicines.  Chronic lung problems (COPD).  Cancer. SYMPTOMS   Cough.  Fever.  Chest pain.  Increased breathing rate.  High-pitched whistling sound when breathing (wheezing).  Colored mucus that you cough up (sputum). TREATMENT   A bacterial cough may be treated with antibiotic medicine.  A viral cough must run its course and will not respond to antibiotics.  Your caregiver may recommend other treatments if you have a chronic cough. HOME CARE INSTRUCTIONS   Only take over-the-counter or prescription medicines for pain, discomfort, or fever as directed by your caregiver. Use cough suppressants only as directed by your caregiver.  Use a cold steam vaporizer or humidifier in your bedroom or home to help loosen secretions.  Sleep in a semi-upright position if your cough is worse at night.  Rest as needed.  Stop smoking if you smoke. SEEK IMMEDIATE MEDICAL CARE IF:   You have pus in your sputum.  Your cough starts to worsen.  You cannot control your cough with suppressants and are losing sleep.  You begin coughing up blood.  You have difficulty breathing.  You develop pain which is getting worse or is uncontrolled with medicine.  You have a fever. MAKE SURE YOU:   Understand these instructions.  Will watch your condition.  Will get help right away if you are not doing well or get worse. Document Released: 04/09/2011 Document Revised: 01/03/2012 Document Reviewed: 04/09/2011 Exit[employer] Patient Information 2015 [employer]. This information is not intended  to replace advice given to you by your health care provider. Make sure you discuss any questions you have with your health care provider.  

## 2014-11-08 NOTE — ED Notes (Signed)
Pt requests note to go back to work. Pt had cold symptoms 3 days ago and did not go to work. Pt reports cold symptoms resolved.

## 2015-01-10 ENCOUNTER — Encounter (HOSPITAL_COMMUNITY): Payer: Self-pay

## 2015-01-10 ENCOUNTER — Emergency Department (HOSPITAL_COMMUNITY)
Admission: EM | Admit: 2015-01-10 | Discharge: 2015-01-10 | Disposition: A | Discharge status: Home / Self Care | Attending: Emergency Medicine | Admitting: Emergency Medicine

## 2015-01-10 ENCOUNTER — Emergency Department (HOSPITAL_COMMUNITY)

## 2015-01-10 DIAGNOSIS — Z8739 Personal history of other diseases of the musculoskeletal system and connective tissue: Secondary | ICD-10-CM | POA: Insufficient documentation

## 2015-01-10 DIAGNOSIS — W01198A Fall on same level from slipping, tripping and stumbling with subsequent striking against other object, initial encounter: Secondary | ICD-10-CM | POA: Insufficient documentation

## 2015-01-10 DIAGNOSIS — Y9289 Other specified places as the place of occurrence of the external cause: Secondary | ICD-10-CM | POA: Insufficient documentation

## 2015-01-10 DIAGNOSIS — Z87891 Personal history of nicotine dependence: Secondary | ICD-10-CM | POA: Insufficient documentation

## 2015-01-10 DIAGNOSIS — Y998 Other external cause status: Secondary | ICD-10-CM | POA: Insufficient documentation

## 2015-01-10 DIAGNOSIS — S0081XA Abrasion of other part of head, initial encounter: Secondary | ICD-10-CM | POA: Insufficient documentation

## 2015-01-10 DIAGNOSIS — F1210 Cannabis abuse, uncomplicated: Secondary | ICD-10-CM | POA: Insufficient documentation

## 2015-01-10 DIAGNOSIS — Z8614 Personal history of Methicillin resistant Staphylococcus aureus infection: Secondary | ICD-10-CM | POA: Insufficient documentation

## 2015-01-10 DIAGNOSIS — S0990XA Unspecified injury of head, initial encounter: Secondary | ICD-10-CM | POA: Insufficient documentation

## 2015-01-10 DIAGNOSIS — Y9389 Activity, other specified: Secondary | ICD-10-CM | POA: Insufficient documentation

## 2015-01-10 DIAGNOSIS — Y929 Unspecified place or not applicable: Secondary | ICD-10-CM | POA: Insufficient documentation

## 2015-01-10 DIAGNOSIS — Y9302 Activity, running: Secondary | ICD-10-CM | POA: Insufficient documentation

## 2015-01-10 DIAGNOSIS — F10920 Alcohol use, unspecified with intoxication, uncomplicated: Secondary | ICD-10-CM

## 2015-01-10 DIAGNOSIS — S0181XA Laceration without foreign body of other part of head, initial encounter: Secondary | ICD-10-CM

## 2015-01-10 DIAGNOSIS — F10120 Alcohol abuse with intoxication, uncomplicated: Secondary | ICD-10-CM | POA: Insufficient documentation

## 2015-01-10 LAB — RAPID URINE DRUG SCREEN, HOSP PERFORMED
Amphetamines: NOT DETECTED
Barbiturates: NOT DETECTED
Benzodiazepines: NOT DETECTED
COCAINE: NOT DETECTED
OPIATES: NOT DETECTED
Tetrahydrocannabinol: POSITIVE — AB

## 2015-01-10 LAB — I-STAT CHEM 8, ED
BUN: 3 mg/dL — ABNORMAL LOW (ref 6–23)
CREATININE: 1.2 mg/dL (ref 0.50–1.35)
Calcium, Ion: 1.09 mmol/L — ABNORMAL LOW (ref 1.12–1.23)
Chloride: 102 mmol/L (ref 96–112)
Glucose, Bld: 103 mg/dL — ABNORMAL HIGH (ref 70–99)
HCT: 43 % (ref 39.0–52.0)
HEMOGLOBIN: 14.6 g/dL (ref 13.0–17.0)
Potassium: 3.4 mmol/L — ABNORMAL LOW (ref 3.5–5.1)
SODIUM: 143 mmol/L (ref 135–145)
TCO2: 23 mmol/L (ref 0–100)

## 2015-01-10 LAB — ETHANOL: ALCOHOL ETHYL (B): 265 mg/dL — AB (ref 0–9)
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
## 2015-01-10 NOTE — ED Notes (Signed)
MD discharge pt to police custody.
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
## 2015-01-10 NOTE — Discharge Instructions (Signed)
Facial Laceration ° A facial laceration is a cut on the face. These injuries can be painful and cause bleeding. Lacerations usually heal quickly, but they need special care to reduce scarring. °DIAGNOSIS  °Your health care provider will take a medical history, ask for details about how the injury occurred, and examine the wound to determine how deep the cut is. °TREATMENT  °Some facial lacerations may not require closure. Others may not be able to be closed because of an increased risk of infection. The risk of infection and the chance for successful closure will depend on various factors, including the amount of time since the injury occurred. °The wound may be cleaned to help prevent infection. If closure is appropriate, pain medicines may be given if needed. Your health care provider will use stitches (sutures), wound glue (adhesive), or skin adhesive strips to repair the laceration. These tools bring the skin edges together to allow for faster healing and a better cosmetic outcome. If needed, you may also be given a tetanus shot. °HOME CARE INSTRUCTIONS °· Only take over-the-counter or prescription medicines as directed by your health care provider. °· Follow your health care provider's instructions for wound care. These instructions will vary depending on the technique used for closing the wound. °For Sutures: °· Keep the wound clean and dry.   °· If you were given a bandage (dressing), you should change it at least once a day. Also change the dressing if it becomes wet or dirty, or as directed by your health care provider.   °· Wash the wound with soap and water 2 times a day. Rinse the wound off with water to remove all soap. Pat the wound dry with a clean towel.   °· After cleaning, apply a thin layer of the antibiotic ointment recommended by your health care provider. This will help prevent infection and keep the dressing from sticking.   °· You may shower as usual after the first 24 hours. Do not soak the  wound in water until the sutures are removed.   °· Get your sutures removed as directed by your health care provider. With facial lacerations, sutures should usually be taken out after 4-5 days to avoid stitch marks.   °· Wait a few days after your sutures are removed before applying any makeup. °For Skin Adhesive Strips: °· Keep the wound clean and dry.   °· Do not get the skin adhesive strips wet. You may bathe carefully, using caution to keep the wound dry.   °· If the wound gets wet, pat it dry with a clean towel.   °· Skin adhesive strips will fall off on their own. You may trim the strips as the wound heals. Do not remove skin adhesive strips that are still stuck to the wound. They will fall off in time.   °For Wound Adhesive: °· You may briefly wet your wound in the shower or bath. Do not soak or scrub the wound. Do not swim. Avoid periods of heavy sweating until the skin adhesive has fallen off on its own. After showering or bathing, gently pat the wound dry with a clean towel.   °· Do not apply liquid medicine, cream medicine, ointment medicine, or makeup to your wound while the skin adhesive is in place. This may loosen the film before your wound is healed.   °· If a dressing is placed over the wound, be careful not to apply tape directly over the skin adhesive. This may cause the adhesive to be pulled off before the wound is healed.   °· Avoid   prolonged exposure to sunlight or tanning lamps while the skin adhesive is in place.  The skin adhesive will usually remain in place for 5-10 days, then naturally fall off the skin. Do not pick at the adhesive film.  After Healing: Once the wound has healed, cover the wound with sunscreen during the day for 1 full year. This can help minimize scarring. Exposure to ultraviolet light in the first year will darken the scar. It can take 1-2 years for the scar to lose its redness and to heal completely.  SEEK IMMEDIATE MEDICAL CARE IF:  You have redness, pain, or  swelling around the wound.   You see ayellowish-white fluid (pus) coming from the wound.   You have chills or a fever.  MAKE SURE YOU:  Understand these instructions.  Will watch your condition.  Will get help right away if you are not doing well or get worse. Document Released: 11/18/2004 Document Revised: 08/01/2013 Document Reviewed: 05/24/2013 [facility] Patient Information 2015 [place], [place]. This information is not intended to replace advice given to you by your health care provider. Make sure you discuss any questions you have with your health care provider.  Head Injury You have a head injury. Headaches and throwing up (vomiting) are common after a head injury. It should be easy to wake up from sleeping. Sometimes you must stay in the hospital. Most problems happen within the first 24 hours. Side effects may occur up to 7-10 days after the injury.  WHAT ARE THE TYPES OF HEAD INJURIES? Head injuries can be as minor as a bump. Some head injuries can be more severe. More severe head injuries include:  A jarring injury to the brain (concussion).  A bruise of the brain (contusion). This mean there is bleeding in the brain that can cause swelling.  A cracked skull (skull fracture).  Bleeding in the brain that collects, clots, and forms a bump (hematoma). WHEN SHOULD I GET HELP RIGHT AWAY?   You are confused or sleepy.  You cannot be woken up.  You feel sick to your stomach (nauseous) or keep throwing up (vomiting).  Your dizziness or unsteadiness is getting worse.  You have very bad, lasting headaches that are not helped by medicine. Take medicines only as told by your doctor.  You cannot use your arms or legs like normal.  You cannot walk.  You notice changes in the black spots in the center of the colored part of your eye (pupil).  You have clear or bloody fluid coming from your nose or ears.  You have trouble seeing. During the next 24 hours after the injury,  you must stay with someone who can watch you. This person should get help right away (call 911 in the [place] if you start to shake and are not able to control it (have seizures), you pass out, or you are unable to wake up. HOW CAN I PREVENT A HEAD INJURY IN THE FUTURE?  Wear seat belts.  Wear a helmet while bike riding and playing sports like football.  Stay away from dangerous activities around the house. WHEN CAN I RETURN TO NORMAL ACTIVITIES AND ATHLETICS? See your doctor before doing these activities. You should not do normal activities or play contact sports until 1 week after the following symptoms have stopped:  Headache that does not go away.  Dizziness.  Poor attention.  Confusion.  Memory problems.  Sickness to your stomach or throwing up.  Tiredness.  Fussiness.  Bothered by bright lights  or loud noises.  Anxiousness or depression.  Restless sleep. MAKE SURE YOU:   Understand these instructions.  Will watch your condition.  Will get help right away if you are not doing well or get worse. Document Released: 09/23/2008 Document Revised: 02/25/2014 Document Reviewed: 06/18/2013 [facility] Patient Information 2015 [place]. This information is not intended to replace advice given to you by your health care provider. Make sure you discuss any questions you have with your health care provider.  Alcohol Intoxication Alcohol intoxication occurs when you drink enough alcohol that it affects your ability to function. It can be mild or very severe. Drinking a lot of alcohol in a short time is called binge drinking. This can be very harmful. Drinking alcohol can also be more dangerous if you are taking medicines or other drugs. Some of the effects caused by alcohol may include:  Loss of coordination.  Changes in mood and behavior.  Unclear thinking.  Trouble talking (slurred speech).  Throwing up (vomiting).  Confusion.  Slowed breathing.  Twitching and  shaking (seizures).  Loss of consciousness. HOME CARE  Do not drive after drinking alcohol.  Drink enough water and fluids to keep your pee (urine) clear or pale yellow. Avoid caffeine.  Only take medicine as told by your doctor. GET HELP IF:  You throw up (vomit) many times.  You do not feel better after a few days.  You frequently have alcohol intoxication. Your doctor can help decide if you should see a substance use treatment counselor. GET HELP RIGHT AWAY IF:  You become shaky when you stop drinking.  You have twitching and shaking.  You throw up blood. It may look bright red or like coffee grounds.  You notice blood in your poop (bowel movements).  You become lightheaded or pass out (faint). MAKE SURE YOU:   Understand these instructions.  Will watch your condition.  Will get help right away if you are not doing well or get worse. Document Released: 03/29/2008 Document Revised: 06/13/2013 Document Reviewed: 03/16/2013 [facility] Patient Information 2015 [place]. This information is not intended to replace advice given to you by your health care provider. Make sure you discuss any questions you have with your health care provider.

## 2015-01-10 NOTE — Discharge Instructions (Signed)
Make sure that you don't have another head injury.  See your doctor.  Return to ER if you have severe headaches, vomiting, not behaving normally.
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
## 2015-08-18 ENCOUNTER — Encounter (HOSPITAL_COMMUNITY): Payer: Self-pay | Admitting: Family Medicine

## 2015-08-18 ENCOUNTER — Emergency Department (HOSPITAL_COMMUNITY)
Admission: EM | Admit: 2015-08-18 | Discharge: 2015-08-18 | Disposition: A | Discharge status: Home / Self Care | Payer: Self-pay | Attending: Emergency Medicine | Admitting: Emergency Medicine

## 2015-08-18 DIAGNOSIS — Z87891 Personal history of nicotine dependence: Secondary | ICD-10-CM | POA: Insufficient documentation

## 2015-08-18 DIAGNOSIS — F10120 Alcohol abuse with intoxication, uncomplicated: Secondary | ICD-10-CM | POA: Insufficient documentation

## 2015-08-18 DIAGNOSIS — F10920 Alcohol use, unspecified with intoxication, uncomplicated: Secondary | ICD-10-CM

## 2015-08-18 DIAGNOSIS — Z8781 Personal history of (healed) traumatic fracture: Secondary | ICD-10-CM | POA: Insufficient documentation

## 2015-08-18 HISTORY — DX: Fracture of mandible, unspecified, initial encounter for closed fracture: S02.609A

## 2015-08-18 LAB — ETHANOL: Alcohol, Ethyl (B): 51 mg/dL — ABNORMAL HIGH (ref <5)

## 2015-08-18 NOTE — ED Notes (Signed)
Pt is ambulating around department w/ a steady gait.  Asked pt multiple times to stay in room however pt is non compliant w/ this request.
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
## 2015-08-18 NOTE — Discharge Instructions (Signed)
Alcohol Intoxication  Alcohol intoxication occurs when the amount of alcohol that a person has consumed impairs his or her ability to mentally and physically function. Alcohol directly impairs the normal chemical activity of the brain. Drinking large amounts of alcohol can lead to changes in mental function and behavior, and it can cause many physical effects that can be harmful.   Alcohol intoxication can range in severity from mild to very severe. Various factors can affect the level of intoxication that occurs, such as the person's age, gender, weight, frequency of alcohol consumption, and the presence of other medical conditions (such as diabetes, seizures, or heart conditions). Dangerous levels of alcohol intoxication may occur when people drink large amounts of alcohol in a short period (binge drinking). Alcohol can also be especially dangerous when combined with certain prescription medicines or "recreational" drugs.  SIGNS AND SYMPTOMS  Some common signs and symptoms of mild alcohol intoxication include:  · Loss of coordination.  · Changes in mood and behavior.  · Impaired judgment.  · Slurred speech.  As alcohol intoxication progresses to more severe levels, other signs and symptoms will appear. These may include:  · Vomiting.  · Confusion and impaired memory.  · Slowed breathing.  · Seizures.  · Loss of consciousness.  DIAGNOSIS   Your health care provider will take a medical history and perform a physical exam. You will be asked about the amount and type of alcohol you have consumed. Blood tests will be done to measure the concentration of alcohol in your blood. In many places, your blood alcohol level must be lower than 80 mg/dL (0.08%) to legally drive. However, many dangerous effects of alcohol can occur at much lower levels.   TREATMENT   People with alcohol intoxication often do not require treatment. Most of the effects of alcohol intoxication are temporary, and they go away as the alcohol naturally  leaves the body. Your health care provider will monitor your condition until you are stable enough to go home. Fluids are sometimes given through an IV access tube to help prevent dehydration.   HOME CARE INSTRUCTIONS  · Do not drive after drinking alcohol.  · Stay hydrated. Drink enough water and fluids to keep your urine clear or pale yellow. Avoid caffeine.    · Only take over-the-counter or prescription medicines as directed by your health care provider.    SEEK MEDICAL CARE IF:   · You have persistent vomiting.    · You do not feel better after a few days.  · You have frequent alcohol intoxication. Your health care provider can help determine if you should see a substance use treatment counselor.  SEEK IMMEDIATE MEDICAL CARE IF:   · You become shaky or tremble when you try to stop drinking.    · You shake uncontrollably (seizure).    · You throw up (vomit) blood. This may be bright red or may look like black coffee grounds.    · You have blood in your stool. This may be bright red or may appear as a black, tarry, bad smelling stool.    · You become lightheaded or faint.    MAKE SURE YOU:   · Understand these instructions.  · Will watch your condition.  · Will get help right away if you are not doing well or get worse.     This information is not intended to replace advice given to you by your health care provider. Make sure you discuss any questions you have with your health care provider.       Document Released: 07/21/2005 Document Revised: 06/13/2013 Document Reviewed: 03/16/2013  Elsevier Interactive Patient Education ©2016 [employer] Inc[employer]
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
## 2015-11-05 ENCOUNTER — Emergency Department (HOSPITAL_COMMUNITY)
Admission: EM | Admit: 2015-11-05 | Discharge: 2015-11-05 | Disposition: A | Discharge status: Home / Self Care | Attending: Emergency Medicine | Admitting: Emergency Medicine

## 2015-11-05 ENCOUNTER — Encounter (HOSPITAL_COMMUNITY): Payer: Self-pay

## 2015-11-05 DIAGNOSIS — Z8614 Personal history of Methicillin resistant Staphylococcus aureus infection: Secondary | ICD-10-CM | POA: Insufficient documentation

## 2015-11-05 DIAGNOSIS — Z87891 Personal history of nicotine dependence: Secondary | ICD-10-CM | POA: Insufficient documentation

## 2015-11-05 DIAGNOSIS — Z792 Long term (current) use of antibiotics: Secondary | ICD-10-CM | POA: Insufficient documentation

## 2015-11-05 DIAGNOSIS — Z8781 Personal history of (healed) traumatic fracture: Secondary | ICD-10-CM | POA: Insufficient documentation

## 2015-11-05 DIAGNOSIS — L0211 Cutaneous abscess of neck: Secondary | ICD-10-CM | POA: Insufficient documentation

## 2015-11-05 MED ORDER — SULFAMETHOXAZOLE-TRIMETHOPRIM 800-160 MG PO TABS
1.0000 | ORAL_TABLET | Freq: Once | ORAL | Status: AC
  Administered 2015-11-05: 1 via ORAL
  Filled 2015-11-05: qty 1

## 2015-11-05 MED ORDER — NAPROXEN 500 MG PO TABS
500.0000 mg | ORAL_TABLET | Freq: Two times a day (BID) | ORAL | Status: DC
Start: 2015-11-05 — End: 2016-03-10

## 2015-11-05 MED ORDER — SULFAMETHOXAZOLE-TRIMETHOPRIM 800-160 MG PO TABS: 1.0000 | ORAL_TABLET | Freq: Two times a day (BID) | ORAL | Status: AC

## 2015-11-05 NOTE — Discharge Instructions (Signed)
Apply warm compresses 2-3 times per day for 15-20 minutes each time. Take Bactrim as prescribed. Return for wound recheck in 2 days (on 11/07/15) if symptoms persist or worsen.  Abscess An abscess is an infected area that contains a collection of pus and debris.It can occur in almost any part of the body. An abscess is also known as a furuncle or boil. CAUSES  An abscess occurs when tissue gets infected. This can occur from blockage of oil or sweat glands, infection of hair follicles, or a minor injury to the skin. As the body tries to fight the infection, pus collects in the area and creates pressure under the skin. This pressure causes pain. People with weakened immune systems have difficulty fighting infections and get certain abscesses more often.  SYMPTOMS Usually an abscess develops on the skin and becomes a painful mass that is red, warm, and tender. If the abscess forms under the skin, you may feel a moveable soft area under the skin. Some abscesses break open (rupture) on their own, but most will continue to get worse without care. The infection can spread deeper into the body and eventually into the bloodstream, causing you to feel ill.  DIAGNOSIS  Your caregiver will take your medical history and perform a physical exam. A sample of fluid may also be taken from the abscess to determine what is causing your infection. TREATMENT  Your caregiver may prescribe antibiotic medicines to fight the infection. However, taking antibiotics alone usually does not cure an abscess. Your caregiver may need to make a small cut (incision) in the abscess to drain the pus. In some cases, gauze is packed into the abscess to reduce pain and to continue draining the area. HOME CARE INSTRUCTIONS   Only take over-the-counter or prescription medicines for pain, discomfort, or fever as directed by your caregiver.  If you were prescribed antibiotics, take them as directed. Finish them even if you start to feel  better.  If gauze is used, follow your caregiver's directions for changing the gauze.  To avoid spreading the infection:  Keep your draining abscess covered with a bandage.  Wash your hands well.  Do not share personal care items, towels, or whirlpools with others.  Avoid skin contact with others.  Keep your skin and clothes clean around the abscess.  Keep all follow-up appointments as directed by your caregiver. SEEK MEDICAL CARE IF:   You have increased pain, swelling, redness, fluid drainage, or bleeding.  You have muscle aches, chills, or a general ill feeling.  You have a fever. MAKE SURE YOU:   Understand these instructions.  Will watch your condition.  Will get help right away if you are not doing well or get worse.   This information is not intended to replace advice given to you by your health care provider. Make sure you discuss any questions you have with your health care provider.   Document Released: 07/21/2005 Document Revised: 04/11/2012 Document Reviewed: 12/24/2011 Elsevier Interactive Patient Education [employer]

## 2015-11-05 NOTE — ED Notes (Signed)
Pt reports he was bitten on the back of his neck by an insect about 4-5 days ago and then noticed a small bump to site. Small circular bump to area with scant bleeding noted. No itching.

## 2015-11-11 DIAGNOSIS — Z8614 Personal history of Methicillin resistant Staphylococcus aureus infection: Secondary | ICD-10-CM | POA: Insufficient documentation

## 2015-11-11 DIAGNOSIS — Z791 Long term (current) use of non-steroidal anti-inflammatories (NSAID): Secondary | ICD-10-CM | POA: Insufficient documentation

## 2015-11-11 DIAGNOSIS — Z87891 Personal history of nicotine dependence: Secondary | ICD-10-CM | POA: Insufficient documentation

## 2015-11-11 DIAGNOSIS — L02511 Cutaneous abscess of right hand: Secondary | ICD-10-CM | POA: Insufficient documentation

## 2015-11-12 ENCOUNTER — Encounter (HOSPITAL_COMMUNITY): Payer: Self-pay | Admitting: Emergency Medicine

## 2015-11-12 ENCOUNTER — Emergency Department (HOSPITAL_COMMUNITY)
Admission: EM | Admit: 2015-11-12 | Discharge: 2015-11-12 | Disposition: A | Discharge status: Home / Self Care | Attending: Emergency Medicine | Admitting: Emergency Medicine

## 2015-11-12 DIAGNOSIS — L02511 Cutaneous abscess of right hand: Secondary | ICD-10-CM

## 2015-11-12 MED ORDER — SULFAMETHOXAZOLE-TRIMETHOPRIM 800-160 MG PO TABS: 1.0000 | ORAL_TABLET | Freq: Two times a day (BID) | ORAL | Status: DC

## 2015-11-12 MED ORDER — IBUPROFEN 400 MG PO TABS
800.0000 mg | ORAL_TABLET | Freq: Once | ORAL | Status: AC
  Administered 2015-11-12: 800 mg via ORAL
  Filled 2015-11-12: qty 2

## 2015-11-12 MED ORDER — CEPHALEXIN 500 MG PO CAPS: ORAL_CAPSULE | ORAL | Status: DC
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
## 2015-11-12 NOTE — Discharge Instructions (Signed)
Keep wound clean and dry. Perform warm water soaks for 5-10 mins in Half-strength hydrogen peroxide, four times daily x 5-7 days.. Take antibiotics until it is finished. Use tylenol or motrin as needed for pain. Followup with [facility] wellness in 2 days for wound recheck and to establish care at [place] center. Monitor area for signs of infection to include, but not limited to: increasing pain, spreading redness, drainage/pus, worsening swelling, or fevers. Return to emergency department for emergent changing or worsening symptoms.   Abscess An abscess (boil or furuncle) is an infected area on or under the skin. This area is filled with yellowish-white fluid (pus) and other material (debris). HOME CARE   Only take medicines as told by your doctor.  If you were given antibiotic medicine, take it as directed. Finish the medicine even if you start to feel better.  If gauze is used, follow your doctor's directions for changing the gauze.  To avoid spreading the infection:  Keep your abscess covered with a bandage.  Wash your hands well.  Do not share personal care items, towels, or whirlpools with others.  Avoid skin contact with others.  Keep your skin and clothes clean around the abscess.  Keep all doctor visits as told. GET HELP RIGHT AWAY IF:   You have more pain, puffiness (swelling), or redness in the wound site.  You have more fluid or blood coming from the wound site.  You have muscle aches, chills, or you feel sick.  You have a fever. MAKE SURE YOU:   Understand these instructions.  Will watch your condition.  Will get help right away if you are not doing well or get worse.   This information is not intended to replace advice given to you by your health care provider. Make sure you discuss any questions you have with your health care provider.   Document Released: 03/29/2008 Document Revised: 04/11/2012 Document  Reviewed: 12/25/2011 Elsevier Interactive Patient Education 2016 [employer]  Incision and Drainage Incision and drainage is a procedure in which a sac-like structure (cystic structure) is opened and drained. The area to be drained usually contains material such as pus, fluid, or blood.  LET YOUR CAREGIVER KNOW ABOUT:   Allergies to medicine.  Medicines taken, including vitamins, herbs, eyedrops, over-the-counter medicines, and creams.  Use of steroids (by mouth or creams).  Previous problems with anesthetics or numbing medicines.  History of bleeding problems or blood clots.  Previous surgery.  Other health problems, including diabetes and kidney problems.  Possibility of pregnancy, if this applies. RISKS AND COMPLICATIONS  Pain.  Bleeding.  Scarring.  Infection. BEFORE THE PROCEDURE  You may need to have an ultrasound or other imaging tests to see how large or deep your cystic structure is. Blood tests may also be used to determine if you have an infection or how severe the infection is. You may need to have a tetanus shot. PROCEDURE  The affected area is cleaned with a cleaning fluid. The cyst area will then be numbed with a medicine (local anesthetic). A small incision will be made in the cystic structure. A syringe or catheter may be used to drain the contents of the cystic structure, or the contents may be squeezed out. The area will then be flushed with a cleansing solution. After cleansing the area, it is often gently packed with a gauze or another wound dressing. Once it is packed, it will be covered with gauze and  tape or some other type of wound dressing. AFTER THE PROCEDURE   Often, you will be allowed to go home right after the procedure.  You may be given antibiotic medicine to prevent or heal an infection.  If the area was packed with gauze or some other wound dressing, you will likely need to come back in 1 to 2 days to get it removed.  The area should  heal in about 14 days.   This information is not intended to replace advice given to you by your health care provider. Make sure you discuss any questions you have with your health care provider.   Document Released: 04/06/2001 Document Revised: 04/11/2012 Document Reviewed: 12/06/2011 Elsevier Interactive Patient Education [employer]

## 2015-11-14 ENCOUNTER — Encounter (HOSPITAL_COMMUNITY): Payer: Self-pay | Admitting: Emergency Medicine
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
## 2016-02-28 ENCOUNTER — Emergency Department (HOSPITAL_COMMUNITY)
Admission: EM | Admit: 2016-02-28 | Discharge: 2016-02-28 | Disposition: A | Discharge status: Home / Self Care | Payer: Self-pay | Attending: Emergency Medicine | Admitting: Emergency Medicine

## 2016-02-28 ENCOUNTER — Encounter (HOSPITAL_COMMUNITY): Payer: Self-pay | Admitting: *Deleted

## 2016-02-28 ENCOUNTER — Emergency Department (HOSPITAL_COMMUNITY): Payer: Self-pay

## 2016-02-28 DIAGNOSIS — G43A0 Cyclical vomiting, not intractable: Secondary | ICD-10-CM | POA: Insufficient documentation

## 2016-02-28 DIAGNOSIS — Z87891 Personal history of nicotine dependence: Secondary | ICD-10-CM | POA: Insufficient documentation

## 2016-02-28 DIAGNOSIS — N4400 Torsion of testis, unspecified: Secondary | ICD-10-CM

## 2016-02-28 DIAGNOSIS — Z8781 Personal history of (healed) traumatic fracture: Secondary | ICD-10-CM | POA: Insufficient documentation

## 2016-02-28 DIAGNOSIS — N50819 Testicular pain, unspecified: Secondary | ICD-10-CM

## 2016-02-28 DIAGNOSIS — N50812 Left testicular pain: Secondary | ICD-10-CM | POA: Insufficient documentation

## 2016-02-28 DIAGNOSIS — Z8614 Personal history of Methicillin resistant Staphylococcus aureus infection: Secondary | ICD-10-CM | POA: Insufficient documentation

## 2016-02-28 DIAGNOSIS — R1115 Cyclical vomiting syndrome unrelated to migraine: Secondary | ICD-10-CM

## 2016-02-28 DIAGNOSIS — N5089 Other specified disorders of the male genital organs: Secondary | ICD-10-CM

## 2016-02-28 DIAGNOSIS — N433 Hydrocele, unspecified: Secondary | ICD-10-CM

## 2016-02-28 LAB — URINALYSIS, ROUTINE W REFLEX MICROSCOPIC
Bilirubin Urine: NEGATIVE
GLUCOSE, UA: NEGATIVE mg/dL
KETONES UR: NEGATIVE mg/dL
LEUKOCYTES UA: NEGATIVE
NITRITE: NEGATIVE
PROTEIN: NEGATIVE mg/dL
Specific Gravity, Urine: 1.019 (ref 1.005–1.030)
pH: 7.5 (ref 5.0–8.0)

## 2016-02-28 LAB — URINE MICROSCOPIC-ADD ON
Squamous Epithelial / LPF: NONE SEEN
WBC UA: NONE SEEN WBC/hpf (ref 0–5)

## 2016-02-28 MED ORDER — STERILE WATER FOR INJECTION IJ SOLN
INTRAMUSCULAR | Status: AC
  Filled 2016-02-28: qty 10

## 2016-02-28 MED ORDER — IBUPROFEN 400 MG PO TABS
600.0000 mg | ORAL_TABLET | Freq: Once | ORAL | Status: AC
  Administered 2016-02-28: 600 mg via ORAL
  Filled 2016-02-28: qty 1

## 2016-02-28 MED ORDER — CEFTRIAXONE SODIUM 250 MG IJ SOLR
250.0000 mg | Freq: Once | INTRAMUSCULAR | Status: AC
  Administered 2016-02-28: 250 mg via INTRAMUSCULAR
  Filled 2016-02-28: qty 250

## 2016-02-28 MED ORDER — SODIUM CHLORIDE 0.9 % IV BOLUS (SEPSIS): 1000.0000 mL | Freq: Once | INTRAVENOUS | Status: DC

## 2016-02-28 MED ORDER — AZITHROMYCIN 250 MG PO TABS
1000.0000 mg | ORAL_TABLET | Freq: Once | ORAL | Status: AC
  Administered 2016-02-28: 1000 mg via ORAL
  Filled 2016-02-28: qty 4

## 2016-02-28 MED ORDER — METOCLOPRAMIDE HCL 5 MG/ML IJ SOLN
10.0000 mg | Freq: Once | INTRAMUSCULAR | Status: AC
  Administered 2016-02-28: 10 mg via INTRAMUSCULAR
  Filled 2016-02-28: qty 2

## 2016-02-28 MED ORDER — ONDANSETRON 4 MG PO TBDP
4.0000 mg | ORAL_TABLET | Freq: Once | ORAL | Status: AC
  Administered 2016-02-28: 4 mg via ORAL
  Filled 2016-02-28: qty 1

## 2016-02-28 MED ORDER — ONDANSETRON 4 MG PO TBDP: 4.0000 mg | ORAL_TABLET | Freq: Three times a day (TID) | ORAL | Status: DC | PRN

## 2016-02-28 MED ORDER — STERILE WATER FOR INJECTION IJ SOLN
0.9000 mL | Freq: Once | INTRAMUSCULAR | Status: AC
  Administered 2016-02-28: 0.9 mL via INTRAMUSCULAR

## 2016-02-28 MED ORDER — LORAZEPAM 2 MG/ML IJ SOLN
1.0000 mg | Freq: Once | INTRAMUSCULAR | Status: AC
  Administered 2016-02-28: 1 mg via INTRAMUSCULAR
  Filled 2016-02-28: qty 1

## 2016-02-28 NOTE — ED Provider Notes (Signed)
Assumed care.  + acute onset of vomiting.  Had some flank pain with this.  On my exam, no ttp or CVAT.  UA pending.  He says a doctor told him to stop smoking MJ which he still does very regularly.  Has had CTs looking for stone which was neg at that time.  Vitals stable.  After bout of NBNB emsis, he feels much better. Still needs his abx and to give urine sample.  Testicle us without torsion or inflammation  6:40 PM vomiting.  Says he was admitted 2x in the last month for same sx.   Pt was noted to be sticking his fingers into his mouth to purposefully vomit.  6:53 PM spoke about cyclical vomiting, he remembers being dx with this.  Talked about MJ cessation and hot showers. He says hot showers always make him feel better when this happens.   Dc with zofran.  Abd exam remains benign without ttp.  Counseled on strict return precautions.   1. Hydrocele, unspecified hydrocele type   2. Torsion of testicle   3. Swelling of left testicle   4. Testicle pain   5. Non-intractable cyclical vomiting with nausea       Sofie RowerMike Beatris Belen, MD 02/28/16 [phone]  Margarita Grizzleanielle Ray, MD 02/28/16 [phone]

## 2016-02-28 NOTE — ED Provider Notes (Signed)
CSN: [id number]     Arrival date & time 02/28/16  1114 History   First MD Initiated Contact with Patient 02/28/16 1424     Chief Complaint  Patient presents with  . Testicle Pain     (Consider location/radiation/quality/duration/timing/severity/associated sxs/prior Treatment) HPI Comments: 25 year old male with no significant past medical history presents for left testicular pain and swelling. The patient reports that this started over the last 2 days. He reports that he noticed it was swollen and tried to squeeze something out of his left testicle but this was unsuccessful. He reports he is only sexually active with his wife. He said that the last time that they had sex was 2 months ago. He denies any penile discharge or pain. Denies any rashes.  Patient is a 25 y.o. male presenting with testicular pain.  Testicle Pain Pertinent negatives include no chest pain, no abdominal pain, no headaches and no shortness of breath.    Past Medical History  Diagnosis Date  . Jaw fracture (HCC)   . History of MRSA infection 12/2010    right hand  . 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 Retained orthopedic hardware 10/2012    jaw   Past Surgical History  Procedure Laterality Date  . Finger debridement  01/08/2011    right long finger; I & D flexor tendon sheath  . Orif mandibular fracture  09/17/2012    Procedure: OPEN REDUCTION INTERNAL FIXATION (ORIF) MANDIBULAR FRACTURE;  Surgeon: Christia Reading, MD;  Location: [facility] OR;  Service: ENT;  Laterality: N/A;  orif mandible fx and MMF  . Trigger finger release  01/08/2011    right long finger  . Mandibular hardware removal  11/10/2012    Procedure: MANDIBULAR HARDWARE REMOVAL;  Surgeon: Christia Reading, MD;  Location: [facility];  Service: ENT;  Laterality: Bilateral;   History reviewed. No pertinent family history. Social History  Substance Use Topics  . Smoking status: Former Smoker -- 6 years    Types: Cigarettes    Quit  date: 05/08/2013  . Smokeless tobacco: Never Used     Comment: 7-8 cig./day  . Alcohol Use: Yes     Comment: occasionally    Review of Systems  Constitutional: Negative for fever, chills and fatigue.  HENT: Negative for congestion and postnasal drip.   Eyes: Negative for visual disturbance.  Respiratory: Negative for cough, chest tightness and shortness of breath.   Cardiovascular: Negative for chest pain and palpitations.  Gastrointestinal: Negative for nausea, vomiting, abdominal pain, diarrhea and constipation.  Genitourinary: Positive for testicular pain. Negative for dysuria, urgency, hematuria, decreased urine volume and genital sores.  Musculoskeletal: Negative for myalgias and back pain.  Skin: Negative for rash.  Neurological: Negative for dizziness, weakness and headaches.  Hematological: Does not bruise/bleed easily.      Allergies  Review of patient's allergies indicates no known allergies.  Home Medications   Prior to Admission medications   Medication Sig Start Date End Date Taking? Authorizing Provider  cephALEXin (KEFLEX) 500 MG capsule 2 caps po bid x 7 days Patient not taking: Reported on 02/28/2016 11/12/15   Mercedes Camprubi-Soms, PA-C  HYDROcodone-acetaminophen (NORCO/VICODIN) 5-325 MG per tablet Take 2 tablets by mouth every 6 (six) hours as needed for pain. Patient not taking: Reported on 01/10/2015 11/10/12   Christia Reading, MD  naproxen (NAPROSYN) 500 MG tablet Take 1 tablet (500 mg total) by mouth 2 (two) times daily. Patient not taking: Reported on 02/28/2016  11/05/15   Antony Madura, PA-C  sulfamethoxazole-trimethoprim (BACTRIM DS,SEPTRA DS) 800-160 MG tablet Take 1 tablet by mouth 2 (two) times daily. Patient not taking: Reported on 02/28/2016 11/12/15   Mercedes Camprubi-Soms, PA-C   BP 119/70 mmHg  Pulse 71  Temp(Src) 98 F (36.7 C) (Oral)  Resp 16  SpO2 100% Physical Exam  Constitutional: He is oriented to person, place, and time. He appears  well-developed and well-nourished. No distress.  HENT:  Head: Normocephalic and atraumatic.  Right Ear: External ear normal.  Left Ear: External ear normal.  Mouth/Throat: Oropharynx is clear and moist. No oropharyngeal exudate.  Eyes: EOM are normal. Pupils are equal, round, and reactive to light.  Neck: Normal range of motion. Neck supple.  Cardiovascular: Normal rate, regular rhythm, normal heart sounds and intact distal pulses.   No murmur heard. Pulmonary/Chest: Effort normal. No respiratory distress. He has no wheezes. He has no rales.  Abdominal: Soft. He exhibits no distension. There is no tenderness. Hernia confirmed negative in the right inguinal area and confirmed negative in the left inguinal area.  Genitourinary: Penis normal. Cremasteric reflex is present. Right testis shows no mass, no swelling and no tenderness. Right testis is descended. Cremasteric reflex is not absent on the right side. Left testis shows swelling and tenderness. Left testis shows no mass. Left testis is descended. Cremasteric reflex is not absent on the left side. No penile erythema. No discharge found.  Musculoskeletal: He exhibits no edema.  Lymphadenopathy:       Right: No inguinal adenopathy present.       Left: No inguinal adenopathy present.  Neurological: He is alert and oriented to person, place, and time.  Skin: Skin is warm and dry. No rash noted. He is not diaphoretic.  Vitals reviewed.   ED Course  Procedures (including critical care time) Labs Review Labs Reviewed  URINALYSIS, ROUTINE W REFLEX MICROSCOPIC (NOT AT [facility])  GC/CHLAMYDIA PROBE AMP ([place]) NOT AT [facility]    Imaging Review US Scrotum  02/28/2016  CLINICAL DATA:  Patient with left testicular pain and swelling. EXAM: SCROTAL ULTRASOUND DOPPLER ULTRASOUND OF THE TESTICLES TECHNIQUE: Complete ultrasound examination of the testicles, epididymis, and other scrotal structures was performed. Color and spectral Doppler ultrasound  were also utilized to evaluate blood flow to the testicles. COMPARISON:  None. FINDINGS: Right testicle Measurements: 4.0 x 2.1 x 2.8 cm. No mass or microlithiasis visualized. Left testicle Measurements: 3.6 x 2.2 x 2.5 cm. No mass or microlithiasis visualized. Right epididymis:  Normal in size and appearance. Left epididymis:  Normal in size and appearance. Hydrocele:  Small left hydrocele. Varicocele:  Left-greater-than-right varicoceles. Pulsed Doppler interrogation of both testes demonstrates normal low resistance arterial and venous waveforms bilaterally. IMPRESSION: Normal sonographic appearance of the testicles bilaterally. No evidence to suggest torsion. Small left hydrocele. Small bilateral varicoceles. Electronically Signed   By: Annia Belt M.D.   On: 02/28/2016 16:02   [place] Art/ven Flow Abd Pelv Doppler  02/28/2016  CLINICAL DATA:  Patient with left testicular pain and swelling. EXAM: SCROTAL ULTRASOUND DOPPLER ULTRASOUND OF THE TESTICLES TECHNIQUE: Complete ultrasound examination of the testicles, epididymis, and other scrotal structures was performed. Color and spectral Doppler ultrasound were also utilized to evaluate blood flow to the testicles. COMPARISON:  None. FINDINGS: Right testicle Measurements: 4.0 x 2.1 x 2.8 cm. No mass or microlithiasis visualized. Left testicle Measurements: 3.6 x 2.2 x 2.5 cm. No mass or microlithiasis visualized. Right epididymis:  Normal in size and appearance. Left epididymis:  Normal in size and appearance. Hydrocele:  Small left hydrocele. Varicocele:  Left-greater-than-right varicoceles. Pulsed Doppler interrogation of both testes demonstrates normal low resistance arterial and venous waveforms bilaterally. IMPRESSION: Normal sonographic appearance of the testicles bilaterally. No evidence to suggest torsion. Small left hydrocele. Small bilateral varicoceles. Electronically Signed   By: Annia Beltrew  Davis M.D.   On: 02/28/2016 16:02   I have personally reviewed and  evaluated these images and lab results as part of my medical decision-making.   EKG Interpretation None      MDM  Patient was seen and evaluated in stable condition. Bilateral varicoceles on ultrasound as well as small hydrocele on the left. Likely causing his pain. Patient was treated with Rocephin and azithromycin. Case was signed out pending UA results. Plan for patient to be discharged after treatment and UA results. Patient aware of results and plan of care. Final diagnoses:  Hydrocele, unspecified hydrocele type    1. Left hydrocele    Leta BaptistEmily Roe Nguyen, MD 02/28/16 1705

## 2016-02-28 NOTE — ED Notes (Signed)
Pen-pad not available. D/c instructions reviewed with patient. He is getting dressed for discharge.

## 2016-02-28 NOTE — ED Notes (Signed)
Witnessed pt with fingers down throat, self inducing vomiting. RN and MD notified.

## 2016-02-28 NOTE — ED Notes (Signed)
Pt refused to get dressed until he gets a hot shower. Pt d/c in hospital gown.

## 2016-02-28 NOTE — ED Notes (Signed)
Pt reports unilateral testicle pain and swelling since yesterday.

## 2016-02-28 NOTE — Discharge Instructions (Signed)
Hydrocele, Adult  A hydrocele is a collection of fluid in the loose pouch of skin that holds the testicles (scrotum). Usually, it affects only one testicle.  CAUSES  This condition may be caused by:  · An injury to the scrotum.  · An infection.  · A tumor or cancer of the testicle.  · Twisting of a testicle.  · Decreased blood flow to the scrotum.  SYMPTOMS  A hydrocele feels like a water-filled balloon. It may also feel heavy. A hydrocele can cause:  · Swelling of the scrotum. The swelling may decrease when you lie down.  · Swelling of the groin.  · Mild discomfort in the scrotum.  · Pain. This can develop if the hydrocele was caused by infection or twisting.  DIAGNOSIS  This condition may be diagnosed with a medical history, physical exam, and imaging tests. You may also have blood and urine tests to check for infection.  TREATMENT  Treatment may include:  · Watching and waiting, particularly if the hydrocele causes no symptoms.  · Treatment of the underlying condition. This may include using antibiotic medicine.  · Surgery to drain the fluid. Some surgical options include:    Needle aspiration. For this procedure, a needle is used to drain fluid.    Hydrocelectomy. For this procedure, an incision is made in the scrotum to remove the fluid sac.  HOME CARE INSTRUCTIONS  · Keep all follow-up visits as told by your health care provider. This is important.  · Watch the hydrocele for any changes.  · Take over-the-counter and prescription medicines only as told by your health care provider.  · If you were prescribed an antibiotic medicine, use it as told by your health care provider. Do not stop using the antibiotic even if your condition improves.  SEEK MEDICAL CARE IF:  · The swelling in your scrotum or groin gets worse.  · The hydrocele becomes red, firm, tender to the touch, or painful.  · You notice any changes in the hydrocele.  · You have a fever.     This information is not intended to replace advice given to  you by your health care provider. Make sure you discuss any questions you have with your health care provider.     Document Released: 03/31/2010 Document Revised: 02/25/2015 Document Reviewed: 10/07/2014  Elsevier Interactive Patient Education ©2016 [employer]vier Inc[employer]

## 2016-02-28 NOTE — ED Notes (Addendum)
Pt noted to be sitting on floor in the threshold of door vomiting and c/o abdominal pain. Dr Rosalia Hammersay at bedside. Pt reports abdominal pain began 1 hour ago.

## 2016-03-03 ENCOUNTER — Emergency Department (HOSPITAL_COMMUNITY)
Admission: EM | Admit: 2016-03-03 | Discharge: 2016-03-03 | Disposition: A | Discharge status: Home / Self Care | Attending: Emergency Medicine | Admitting: Emergency Medicine

## 2016-03-03 ENCOUNTER — Encounter (HOSPITAL_COMMUNITY): Payer: Self-pay | Admitting: Nurse Practitioner

## 2016-03-03 ENCOUNTER — Emergency Department (HOSPITAL_COMMUNITY): Admission: EM | Admit: 2016-03-03 | Discharge: 2016-03-03 | Disposition: A | Discharge status: Home / Self Care

## 2016-03-03 DIAGNOSIS — R1115 Cyclical vomiting syndrome unrelated to migraine: Secondary | ICD-10-CM

## 2016-03-03 DIAGNOSIS — R1084 Generalized abdominal pain: Secondary | ICD-10-CM

## 2016-03-03 DIAGNOSIS — G43A0 Cyclical vomiting, not intractable: Secondary | ICD-10-CM | POA: Insufficient documentation

## 2016-03-03 DIAGNOSIS — N5082 Scrotal pain: Secondary | ICD-10-CM | POA: Insufficient documentation

## 2016-03-03 DIAGNOSIS — Z8614 Personal history of Methicillin resistant Staphylococcus aureus infection: Secondary | ICD-10-CM | POA: Insufficient documentation

## 2016-03-03 DIAGNOSIS — Z8781 Personal history of (healed) traumatic fracture: Secondary | ICD-10-CM | POA: Insufficient documentation

## 2016-03-03 DIAGNOSIS — Z87891 Personal history of nicotine dependence: Secondary | ICD-10-CM | POA: Insufficient documentation

## 2016-03-03 LAB — URINALYSIS, ROUTINE W REFLEX MICROSCOPIC
BILIRUBIN URINE: NEGATIVE
Glucose, UA: NEGATIVE mg/dL
KETONES UR: 40 mg/dL — AB
LEUKOCYTES UA: NEGATIVE
NITRITE: NEGATIVE
Protein, ur: NEGATIVE mg/dL
Specific Gravity, Urine: 1.021 (ref 1.005–1.030)
pH: 6.5 (ref 5.0–8.0)

## 2016-03-03 LAB — COMPREHENSIVE METABOLIC PANEL
ALBUMIN: 4.5 g/dL (ref 3.5–5.0)
ALT: 19 U/L (ref 17–63)
ANION GAP: 15 (ref 5–15)
AST: 34 U/L (ref 15–41)
Alkaline Phosphatase: 63 U/L (ref 38–126)
BILIRUBIN TOTAL: 0.8 mg/dL (ref 0.3–1.2)
BUN: 6 mg/dL (ref 6–20)
CALCIUM: 9.6 mg/dL (ref 8.9–10.3)
CO2: 23 mmol/L (ref 22–32)
Chloride: 105 mmol/L (ref 101–111)
Creatinine, Ser: 0.79 mg/dL (ref 0.61–1.24)
GFR calc Af Amer: >60 mL/min (ref >60)
GLUCOSE: 91 mg/dL (ref 65–99)
Potassium: 3.9 mmol/L (ref 3.5–5.1)
Sodium: 143 mmol/L (ref 135–145)
TOTAL PROTEIN: 8.4 g/dL — AB (ref 6.5–8.1)

## 2016-03-03 LAB — URINE MICROSCOPIC-ADD ON
Bacteria, UA: NONE SEEN
WBC UA: NONE SEEN WBC/hpf (ref 0–5)

## 2016-03-03 LAB — CBC
HCT: 41.2 % (ref 39.0–52.0)
HEMOGLOBIN: 14.5 g/dL (ref 13.0–17.0)
MCH: 30.5 pg (ref 26.0–34.0)
MCHC: 35.2 g/dL (ref 30.0–36.0)
MCV: 86.7 fL (ref 78.0–100.0)
Platelets: 318 10*3/uL (ref 150–400)
RBC: 4.75 MIL/uL (ref 4.22–5.81)
RDW: 12.4 % (ref 11.5–15.5)
WBC: 8.6 10*3/uL (ref 4.0–10.5)

## 2016-03-03 MED ORDER — AZITHROMYCIN 250 MG PO TABS
1000.0000 mg | ORAL_TABLET | Freq: Once | ORAL | Status: AC
  Administered 2016-03-03: 1000 mg via ORAL
  Filled 2016-03-03: qty 4

## 2016-03-03 MED ORDER — CEFTRIAXONE SODIUM 250 MG IJ SOLR
250.0000 mg | Freq: Once | INTRAMUSCULAR | Status: AC
  Administered 2016-03-03: 250 mg via INTRAMUSCULAR
  Filled 2016-03-03: qty 250

## 2016-03-03 MED ORDER — ONDANSETRON 4 MG PO TBDP
4.0000 mg | ORAL_TABLET | Freq: Once | ORAL | Status: AC
  Administered 2016-03-03: 4 mg via ORAL
  Filled 2016-03-03: qty 1

## 2016-03-03 MED ORDER — STERILE WATER FOR INJECTION IJ SOLN
INTRAMUSCULAR | Status: AC
  Administered 2016-03-03: 10 mL
  Filled 2016-03-03: qty 10

## 2016-03-03 MED ORDER — ONDANSETRON 4 MG PO TBDP: 4.0000 mg | ORAL_TABLET | Freq: Three times a day (TID) | ORAL | Status: DC | PRN

## 2016-03-03 MED ORDER — OXYCODONE-ACETAMINOPHEN 5-325 MG PO TABS
1.0000 | ORAL_TABLET | Freq: Once | ORAL | Status: AC
  Administered 2016-03-03: 1 via ORAL
  Filled 2016-03-03: qty 1

## 2016-03-03 NOTE — ED Notes (Signed)
Pt lowered their self to the floor, this tech pushed wheelchair over to pt to help assist pt into wheelchair, pt was able to assist self into wheelchair with no assistance needed from this tech or Traci, Charity fundraiserN. Gloris Manchesterraci and Ashleigh RN made aware and pt was assisted back to triage to be assessed by triage staff.

## 2016-03-03 NOTE — ED Provider Notes (Signed)
CSN: [id number]     Arrival date & time 03/03/16  1134 History  By signing my name below, I, Emmanuella Mensah, attest that this documentation has been prepared under the direction and in the presence of Marillyn Goren, PA-C. Electronically Signed: Angelene GiovanniEmmanuella Mensah, ED Scribe. 03/03/2016. 12:50 PM.    Chief Complaint  Patient presents with  . Abdominal Pain   The history is provided by the patient. No language interpreter was used.   HPI Comments: Carlena HurlBushra Slattery is a 25 y.o. male who presents to the Emergency Department complaining of gradually worsening bilateral testicular pain onset one week ago. Pt reports associated suprapubic pain and nausea with dizziness from the pain. No alleviating factors noted. Pt has not tried any medication PTA. Pt was seen on 02/28/16 where he was diagnosed with hydrocele. No fever or chills. He states he does not want medicine today. He does want antibiotics as he thinks he noticed some pus coming from his penis today.    Past Medical History  Diagnosis Date  . Jaw fracture (HCC)   . History of MRSA infection 12/2010    right hand  . 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 Retained orthopedic hardware 10/2012    jaw   Past Surgical History  Procedure Laterality Date  . Finger debridement  01/08/2011    right long finger; I & D flexor tendon sheath  . Orif mandibular fracture  09/17/2012    Procedure: OPEN REDUCTION INTERNAL FIXATION (ORIF) MANDIBULAR FRACTURE;  Surgeon: Christia Readingwight Bates, MD;  [facility];  Service: ENT;  Laterality: N/A;  orif mandible fx and MMF  . Trigger finger release  01/08/2011    right long finger  . Mandibular hardware removal  11/10/2012    Procedure: MANDIBULAR HARDWARE REMOVAL;  Surgeon: Christia Readingwight Bates, MD;  Location: [facility];  Service: ENT;  Laterality: Bilateral;   History reviewed. No pertinent family history. Social History  Substance Use Topics  . Smoking status: Former Smoker -- 6 years    Types:  Cigarettes    Quit date: 05/08/2013  . Smokeless tobacco: Never Used     Comment: 7-8 cig./day  . Alcohol Use: Yes     Comment: occasionally    Review of Systems  Constitutional: Negative for fever and chills.  Gastrointestinal: Positive for nausea and abdominal pain. Negative for vomiting.  Genitourinary: Positive for testicular pain.  Neurological: Positive for dizziness.  All other systems reviewed and are negative.     Allergies  Review of patient's allergies indicates no known allergies.  Home Medications   Prior to Admission medications   Medication Sig Start Date End Date Taking? Authorizing Provider  cephALEXin (KEFLEX) 500 MG capsule 2 caps po bid x 7 days Patient not taking: Reported on 02/28/2016 11/12/15   Mercedes Camprubi-Soms, PA-C  HYDROcodone-acetaminophen (NORCO/VICODIN) 5-325 MG per tablet Take 2 tablets by mouth every 6 (six) hours as needed for pain. Patient not taking: Reported on 01/10/2015 11/10/12   Christia Readingwight Bates, MD  naproxen (NAPROSYN) 500 MG tablet Take 1 tablet (500 mg total) by mouth 2 (two) times daily. Patient not taking: Reported on 02/28/2016 11/05/15   Antony MaduraKelly Humes, PA-C  ondansetron (ZOFRAN ODT) 4 MG disintegrating tablet Take 1 tablet (4 mg total) by mouth every 8 (eight) hours as needed for nausea or vomiting. 02/28/16   Sofie RowerMike Stengel, MD  sulfamethoxazole-trimethoprim (BACTRIM DS,SEPTRA DS) 800-160 MG tablet Take 1 tablet by mouth 2 (two) times daily.  Patient not taking: Reported on 02/28/2016 11/12/15   Mercedes Camprubi-Soms, PA-C   BP 114/85 mmHg  Pulse 86  Temp(Src) 98.3 F (36.8 C) (Oral)  Resp 18  SpO2 100% Physical Exam  Constitutional: He is oriented to person, place, and time. He appears well-developed and well-nourished.  Dramatic, moaning, flailing from bed to floor  HENT:  Head: Normocephalic and atraumatic.  Eyes: Conjunctivae are normal.  Neck: Normal range of motion. Neck supple.  Cardiovascular: Normal rate and normal heart  sounds.   Pulmonary/Chest: Effort normal and breath sounds normal. No respiratory distress.  Abdominal: Soft.  No CVA tenderness Pt endorses extreme diffuse abd ttp but with distraction he has no tenderness, no guarding.  Genitourinary:  Penis NL with no discharge, erythema, edema No scrotal tenderness, swelling, erythema  Neurological: He is alert and oriented to person, place, and time.  Skin: Skin is warm and dry.  Psychiatric: He has a normal mood and affect.  Nursing note and vitals reviewed.   ED Course  Procedures (including critical care time) DIAGNOSTIC STUDIES: Oxygen Saturation is 100% on RA, normal by my interpretation.    COORDINATION OF CARE: 12:33 PM- Pt advised of plan for treatment and pt agrees. Pt will receive lab work. He will also receive rocephin, zithromax, and Zofran.    Labs Review Labs Reviewed  COMPREHENSIVE METABOLIC PANEL - Abnormal; Notable for the following:    Total Protein 8.4 (*)    All other components within normal limits  URINALYSIS, ROUTINE W REFLEX MICROSCOPIC (NOT AT [facility]) - Abnormal; Notable for the following:    Hgb urine dipstick TRACE (*)    Ketones, ur 40 (*)    All other components within normal limits  URINE MICROSCOPIC-ADD ON - Abnormal; Notable for the following:    Squamous Epithelial / LPF 0-5 (*)    All other components within normal limits  CBC    Imaging Review No results found.   Noelle Penner, PA-C has personally reviewed and evaluated these images and lab results as part of her medical decision-making.   MDM   Final diagnoses:  Scrotal pain  Generalized abdominal pain  Non-intractable cyclical vomiting with nausea    Pt would like to be treated empirically for STDs. Declines STI screening. States his abd pain is improved after Rocephin shot. Pt initially said he had no nausea, then felt nauseated. He feels improved with Zofran and has had no episodes of emesis in the ED. Labs unremarkable. Will hold off on  further workup today. I did encourage pt to follow up with PCP given ongoing symptoms and to see urology as well. Encouraged MJ cessation if his cyclical vomiting continues. ER return precautions given.   I personally performed the services described in this documentation, which was scribed in my presence. The recorded information has been reviewed and is accurate.     Carlene Coria, PA-C 03/03/16 1317  Loren Racer, MD 03/03/16 [phone]

## 2016-03-03 NOTE — ED Notes (Addendum)
Pt was just discharged with rx for nausea and referral to urology. Pt returns to ED stating that he does not have anyone to pick him up and that he is now vomiting. He drank half a bottle of water then spit it up on the floor and is now laying in it. the pt will not allow staff to check his vital signs. Pt is alert and breathing easily.

## 2016-03-03 NOTE — ED Notes (Signed)
Pt ambulated out of ED, pt states " my mom is here" Pt walked out with no distress. Pt has left the ED.

## 2016-03-03 NOTE — ED Notes (Addendum)
Pt c/o 1 week history of R testicular pain. He was here and dx with hydrocele but pain has now moved up into his R pelvis, R lower abdomen. He reports he is now able to palpate a mass under his skin in his R groin area and hes developed nausea and vomiting. He denies pain now. He also reports some constipation, last bowel movement was 2 days ago. He is alert and breathing easily

## 2016-03-03 NOTE — ED Notes (Signed)
Patient came out of triage room laying in the middle of the triage floor stating his stomach was hurting really bad; staff offered assistance to patient to help but patient just laid in the floor; patient asked several times by staff to please go back into his room, that the RN went to talk to the PA who saw him earlier today; patient kept stating his stomach hurt really bad; security was called to assist in matter; patient still kept saying that he could not move but stretched all the way out on his stomach holding his arms and legs up in the air; staff offered assistance again to patient to help him back in room but patient wanted to do it himself; patient finally went back in the room and then shortly came out of room walking upright without any difficulty or distress; when asked where he was going, patient stated "my mother is out here" and walked out the triage door; security and  Apolonio SchneidersAshleigh, Charity fundraiserN (triage) made aware as well as Molli HazardMatthew, Charity fundraiserN (nurse first)

## 2016-03-03 NOTE — ED Notes (Signed)
PA-C at bedside 

## 2016-03-03 NOTE — ED Notes (Signed)
When this tech came out of the med room of [facility] pt was lying in the floor out side of [facility]. This tech helped pt to his feet and back to bed.

## 2016-03-03 NOTE — Discharge Instructions (Signed)
Take medications as prescribed as needed for nausea. Return to the emergency room for worsening condition or new concerning symptoms. Follow up with your regular doctor. If you don't have a regular doctor use one of the numbers below to establish a primary care doctor. Please also follow up with the urology specialists as soon as possible.    Emergency Department Resource Guide 1) Find a Doctor and Pay Out of Pocket Although you won't have to find out who is covered by your insurance plan, it is a good idea to ask around and get recommendations. You will then need to call the office and see if the doctor you have chosen will accept you as a new patient and what types of options they offer for patients who are self-pay. Some doctors offer discounts or will set up payment plans for their patients who do not have insurance, but you will need to ask so you aren't surprised when you get to your appointment.  2) Contact Your Local Health Department Not all health departments have doctors that can see patients for sick visits, but many do, so it is worth a call to see if yours does. If you don't know where your local health department is, you can check in your phone book. The CDC also has a tool to help you locate your state's health department, and many state websites also have listings of all of their local health departments.  3) Find a Walk-in Clinic If your illness is not likely to be very severe or complicated, you may want to try a walk in clinic. These are popping up all over the country in pharmacies, drugstores, and shopping centers. They're usually staffed by nurse practitioners or physician assistants that have been trained to treat common illnesses and complaints. They're usually fairly quick and inexpensive. However, if you have serious medical issues or chronic medical problems, these are probably not your best option.  No Primary Care Doctor: - Call [facility] at  [phone] - they can help  you locate a primary care doctor that  accepts your insurance, provides certain services, etc. - Physician Referral [phone]- [phone]  Emergency Department Resource Guide 1) Find a Doctor and Pay Out of Pocket Although you won't have to find out who is covered by your insurance plan, it is a good idea to ask around and get recommendations. You will then need to call the office and see if the doctor you have chosen will accept you as a new patient and what types of options they offer for patients who are self-pay. Some doctors offer discounts or will set up payment plans for their patients who do not have insurance, but you will need to ask so you aren't surprised when you get to your appointment.  2) Contact Your Local Health Department Not all health departments have doctors that can see patients for sick visits, but many do, so it is worth a call to see if yours does. If you don't know where your local health department is, you can check in your phone book. The CDC also has a tool to help you locate your state's health department, and many state websites also have listings of all of their local health departments.  3) Find a Walk-in Clinic If your illness is not likely to be very severe or complicated, you may want to try a walk in clinic. These are popping up all over the country in pharmacies, drugstores, and shopping centers. They're usually staffed by nurse  or physician assistants that have been trained to treat common illnesses and complaints. They're usually fairly quick and inexpensive. However, if you have serious medical issues or chronic medical problems, these are probably not your best option. ° °No Primary Care Doctor: °- Call [facility] at  [phone] - they can help you locate a primary care doctor that  accepts your insurance, provides certain services, etc. °- Physician Referral Service- 1-800-533-3463 ° °Chronic Pain Problems: °Organization          Address  Phone   Notes  °[facility]  ([phone]) [phone] Patients need to be referred by their primary care doctor.  ° °Medication Assistance: °Organization         Address  Phone   Notes  °[place] [facility] [address] °[place], [place] [address] [phone]) [phone] --Must be a resident of [place] °-- Must have NO insurance coverage whatsoever (no Medicaid/ Medicare, etc.) °-- The pt. MUST have a primary care doctor that directs their care regularly and follows them in the community °  °[facility]  (866) 331-1348   °[employer]  (888) 892-1162   ° °Agencies that provide inexpensive medical care: °Organization         Address  Phone   Notes  °[facility]  ([phone]) [phone]   °[facility]    ([phone]) 832-7272   °[facility] [address] °[place], [place] [address] [phone]) [phone]   °[facility] [address], °[place] ([phone]) [phone]   °[facility]    (336) 373-0678   °[facility]    (336) 272-1050   °[facility] ° [address], [place] Phone:  ([phone]) [phone], Fax:  ([phone]) [phone] Hours of Operation:  9 am - 6 pm, M-F.  Also accepts Medicaid/Medicare and self-pay.  °[facility] ° [address], [place] Phone: ([phone]) [phone], Fax: ([phone]) [phone]. Hours of Operation:  8:30 am - 5:30 pm, M-F.  Also accepts Medicaid and self-pay.  °[facility] [place] [address], [place] Phone: ([phone]) [phone]   °[facility] [address], [place], [place] (336)[phone], Ext. 123 Mondays & Thursdays: 7-9 AM.  First 15 patients are seen on a first come, first serve basis. °  ° °Medicaid-accepting [place] Providers: ° °Organization         Address  Phone   Notes  °[facility] [address], [place] [phone]) [phone] Also accepts self-pay patients.  °[facility] [address], [place] ° ([phone]) [phone]   °[facility] [address], [place] ([phone]) [phone]   °[facility] [address] [place] [address], [place] ([phone]) [phone]   °[facility] [address], [place]  ° ([phone]) [phone] Only accepts [place] Access Medicaid patients after they have their name applied to their card.  ° °Self-Pay (no insurance) in [place]: ° °Organization         Address  Phone   Notes  °Sickle Cell Patients, [facility] [address], [place] ([phone]) [phone]   °[facility] [address], [place] ([phone]) [phone]   ° [facility] ° [address], [place] ([phone]) [phone]   °[facility]/Dr. Osei-Bonsu ° [address] [place] [address], [place] or [address], [place] [phone] Phone number for both [place] and [place] locations is the same.  [facility] [address], [place] [phone]   [facility] [address] [place] [address], [place] or [address] [phone]   [facility] [address], [place] [phone]) [phone], phone; [phone](336) [phone], fax Sees patients 1st and 3rd Saturday of every month.  Must not qualify for public or private insurance (i.e. Medicaid, Medicare, [place] [employer] Choice, Veterans' Benefits)  Household income should be no more than 200% of the poverty level The clinic cannot treat you if you are pregnant or think you are pregnant  Sexually transmitted diseases are not treated at the clinic.

## 2016-03-10 ENCOUNTER — Emergency Department (HOSPITAL_COMMUNITY): Payer: Self-pay

## 2016-03-10 ENCOUNTER — Encounter (HOSPITAL_COMMUNITY): Payer: Self-pay | Admitting: Emergency Medicine

## 2016-03-10 ENCOUNTER — Emergency Department (HOSPITAL_COMMUNITY)
Admission: EM | Admit: 2016-03-10 | Discharge: 2016-03-10 | Disposition: A | Discharge status: Home / Self Care | Payer: Self-pay | Attending: Emergency Medicine | Admitting: Emergency Medicine

## 2016-03-10 DIAGNOSIS — Z87891 Personal history of nicotine dependence: Secondary | ICD-10-CM | POA: Insufficient documentation

## 2016-03-10 DIAGNOSIS — S66921A Laceration of unspecified muscle, fascia and tendon at wrist and hand level, right hand, initial encounter: Secondary | ICD-10-CM

## 2016-03-10 DIAGNOSIS — Z79891 Long term (current) use of opiate analgesic: Secondary | ICD-10-CM | POA: Insufficient documentation

## 2016-03-10 DIAGNOSIS — F10920 Alcohol use, unspecified with intoxication, uncomplicated: Secondary | ICD-10-CM

## 2016-03-10 DIAGNOSIS — S60221A Contusion of right hand, initial encounter: Secondary | ICD-10-CM

## 2016-03-10 DIAGNOSIS — Z791 Long term (current) use of non-steroidal anti-inflammatories (NSAID): Secondary | ICD-10-CM | POA: Insufficient documentation

## 2016-03-10 DIAGNOSIS — Y929 Unspecified place or not applicable: Secondary | ICD-10-CM | POA: Insufficient documentation

## 2016-03-10 DIAGNOSIS — S81011A Laceration without foreign body, right knee, initial encounter: Secondary | ICD-10-CM

## 2016-03-10 DIAGNOSIS — S0990XA Unspecified injury of head, initial encounter: Secondary | ICD-10-CM | POA: Insufficient documentation

## 2016-03-10 DIAGNOSIS — S0181XA Laceration without foreign body of other part of head, initial encounter: Secondary | ICD-10-CM

## 2016-03-10 DIAGNOSIS — Z23 Encounter for immunization: Secondary | ICD-10-CM | POA: Insufficient documentation

## 2016-03-10 DIAGNOSIS — Z79899 Other long term (current) drug therapy: Secondary | ICD-10-CM | POA: Insufficient documentation

## 2016-03-10 DIAGNOSIS — S61511A Laceration without foreign body of right wrist, initial encounter: Secondary | ICD-10-CM | POA: Insufficient documentation

## 2016-03-10 DIAGNOSIS — S81012A Laceration without foreign body, left knee, initial encounter: Secondary | ICD-10-CM

## 2016-03-10 DIAGNOSIS — Y939 Activity, unspecified: Secondary | ICD-10-CM | POA: Insufficient documentation

## 2016-03-10 DIAGNOSIS — Y999 Unspecified external cause status: Secondary | ICD-10-CM | POA: Insufficient documentation

## 2016-03-10 DIAGNOSIS — Z792 Long term (current) use of antibiotics: Secondary | ICD-10-CM | POA: Insufficient documentation

## 2016-03-10 LAB — COMPREHENSIVE METABOLIC PANEL
ALBUMIN: 4.7 g/dL (ref 3.5–5.0)
ALK PHOS: 51 U/L (ref 38–126)
ALT: 17 U/L (ref 17–63)
AST: 29 U/L (ref 15–41)
Anion gap: 8 (ref 5–15)
BILIRUBIN TOTAL: 0.9 mg/dL (ref 0.3–1.2)
BUN: 8 mg/dL (ref 6–20)
CALCIUM: 9.3 mg/dL (ref 8.9–10.3)
CO2: 27 mmol/L (ref 22–32)
CREATININE: 0.95 mg/dL (ref 0.61–1.24)
Chloride: 107 mmol/L (ref 101–111)
GFR calc Af Amer: >60 mL/min (ref >60)
GLUCOSE: 92 mg/dL (ref 65–99)
POTASSIUM: 3.7 mmol/L (ref 3.5–5.1)
Sodium: 142 mmol/L (ref 135–145)
TOTAL PROTEIN: 8 g/dL (ref 6.5–8.1)

## 2016-03-10 LAB — RAPID URINE DRUG SCREEN, HOSP PERFORMED
Amphetamines: NOT DETECTED
BARBITURATES: NOT DETECTED
BENZODIAZEPINES: NOT DETECTED
Cocaine: NOT DETECTED
Opiates: NOT DETECTED
Tetrahydrocannabinol: POSITIVE — AB

## 2016-03-10 LAB — CBC
HEMATOCRIT: 40.2 % (ref 39.0–52.0)
HEMOGLOBIN: 14.2 g/dL (ref 13.0–17.0)
MCH: 30.4 pg (ref 26.0–34.0)
MCHC: 35.3 g/dL (ref 30.0–36.0)
MCV: 86.1 fL (ref 78.0–100.0)
Platelets: 303 10*3/uL (ref 150–400)
RBC: 4.67 MIL/uL (ref 4.22–5.81)
RDW: 12.6 % (ref 11.5–15.5)
WBC: 12.4 10*3/uL — AB (ref 4.0–10.5)

## 2016-03-10 LAB — ETHANOL: ALCOHOL ETHYL (B): 122 mg/dL — AB (ref <5)

## 2016-03-10 LAB — SALICYLATE LEVEL: Salicylate Lvl: <4 mg/dL (ref 2.8–30.0)

## 2016-03-10 MED ORDER — TETANUS-DIPHTH-ACELL PERTUSSIS 5-2.5-18.5 LF-MCG/0.5 IM SUSP
0.5000 mL | Freq: Once | INTRAMUSCULAR | Status: AC
  Administered 2016-03-10: 0.5 mL via INTRAMUSCULAR

## 2016-03-10 MED ORDER — LIDOCAINE HCL 2 % IJ SOLN
10.0000 mL | Freq: Once | INTRAMUSCULAR | Status: DC
  Filled 2016-03-10: qty 20

## 2016-03-10 MED ORDER — TRAMADOL HCL 50 MG PO TABS: 50.0000 mg | ORAL_TABLET | Freq: Four times a day (QID) | ORAL | Status: AC | PRN

## 2016-03-10 MED ORDER — CEPHALEXIN 500 MG PO CAPS: 500.0000 mg | ORAL_CAPSULE | Freq: Four times a day (QID) | ORAL | Status: DC

## 2016-03-10 MED ORDER — TETANUS-DIPHTH-ACELL PERTUSSIS 5-2.5-18.5 LF-MCG/0.5 IM SUSP
INTRAMUSCULAR | Status: AC
  Filled 2016-03-10: qty 0.5

## 2016-03-10 MED ORDER — NAPROXEN 500 MG PO TABS: 500.0000 mg | ORAL_TABLET | Freq: Two times a day (BID) | ORAL | Status: DC

## 2016-03-10 MED ORDER — CEPHALEXIN 500 MG PO CAPS
1000.0000 mg | ORAL_CAPSULE | Freq: Once | ORAL | Status: AC
  Administered 2016-03-10: 1000 mg via ORAL
  Filled 2016-03-10: qty 2

## 2016-03-10 NOTE — ED Notes (Signed)
Paper doc done at down time.  Triage complete 2 - IVC paper work at station

## 2016-03-10 NOTE — ED Notes (Signed)
[place]. Patient in bed talking on phone with officers outside of room. Patient calm, cooperative and denies any needs at this time. Patient waiting for provider to place sutures to wounds.

## 2016-03-10 NOTE — Discharge Instructions (Signed)
Make sure you follow-up with the hand specialist. Call today for an appointment. Waiting more than 5 days would put you at risk for more complications.  Stitches in your knee need to be removed in 10 days.  Laceration Care, Adult A laceration is a cut that goes through all of the layers of the skin and into the tissue that is right under the skin. Some lacerations heal on their own. Others need to be closed with stitches (sutures), staples, skin adhesive strips, or skin glue. Proper laceration care minimizes the risk of infection and helps the laceration to heal better. HOW TO CARE FOR YOUR LACERATION If sutures or staples were used:  Keep the wound clean and dry.  If you were given a bandage (dressing), you should change it at least one time per day or as told by your health care provider. You should also change it if it becomes wet or dirty.  Keep the wound completely dry for the first 24 hours or as told by your health care provider. After that time, you may shower or bathe. However, make sure that the wound is not soaked in water until after the sutures or staples have been removed.  Clean the wound one time each day or as told by your health care provider:  Wash the wound with soap and water.  Rinse the wound with water to remove all soap.  Pat the wound dry with a clean towel. Do not rub the wound.  After cleaning the wound, apply a thin layer of antibiotic ointmentas told by your health care provider. This will help to prevent infection and keep the dressing from sticking to the wound.  Have the sutures or staples removed as told by your health care provider. If skin adhesive strips were used:  Keep the wound clean and dry.  If you were given a bandage (dressing), you should change it at least one time per day or as told by your health care provider. You should also change it if it becomes dirty or wet.  Do not get the skin adhesive strips wet. You may shower or bathe, but be  careful to keep the wound dry.  If the wound gets wet, pat it dry with a clean towel. Do not rub the wound.  Skin adhesive strips fall off on their own. You may trim the strips as the wound heals. Do not remove skin adhesive strips that are still stuck to the wound. They will fall off in time. If skin glue was used:  Try to keep the wound dry, but you may briefly wet it in the shower or bath. Do not soak the wound in water, such as by swimming.  After you have showered or bathed, gently pat the wound dry with a clean towel. Do not rub the wound.  Do not do any activities that will make you sweat heavily until the skin glue has fallen off on its own.  Do not apply liquid, cream, or ointment medicine to the wound while the skin glue is in place. Using those may loosen the film before the wound has healed.  If you were given a bandage (dressing), you should change it at least one time per day or as told by your health care provider. You should also change it if it becomes dirty or wet.  If a dressing is placed over the wound, be careful not to apply tape directly over the skin glue. Doing that may cause the glue to  be pulled off before the wound has healed.  Do not pick at the glue. The skin glue usually remains in place for 5-10 days, then it falls off of the skin. General Instructions  Take over-the-counter and prescription medicines only as told by your health care provider.  If you were prescribed an antibiotic medicine or ointment, take or apply it as told by your doctor. Do not stop using it even if your condition improves.  To help prevent scarring, make sure to cover your wound with sunscreen whenever you are outside after stitches are removed, after adhesive strips are removed, or when glue remains in place and the wound is healed. Make sure to wear a sunscreen of at least 30 SPF.  Do not scratch or pick at the wound.  Keep all follow-up visits as told by your health care  provider. This is important.  Check your wound every day for signs of infection. Watch for:  Redness, swelling, or pain.  Fluid, blood, or pus.  Raise (elevate) the injured area above the level of your heart while you are sitting or lying down, if possible. SEEK MEDICAL CARE IF:  You received a tetanus shot and you have swelling, severe pain, redness, or bleeding at the injection site.  You have a fever.  A wound that was closed breaks open.  You notice a bad smell coming from your wound or your dressing.  You notice something coming out of the wound, such as wood or glass.  Your pain is not controlled with medicine.  You have increased redness, swelling, or pain at the site of your wound.  You have fluid, blood, or pus coming from your wound.  You notice a change in the color of your skin near your wound.  You need to change the dressing frequently due to fluid, blood, or pus draining from the wound.  You develop a new rash.  You develop numbness around the wound. SEEK IMMEDIATE MEDICAL CARE IF:  You develop severe swelling around the wound.  Your pain suddenly increases and is severe.  You develop painful lumps near the wound or on skin that is anywhere on your body.  You have a red streak going away from your wound.  The wound is on your hand or foot and you cannot properly move a finger or toe.  The wound is on your hand or foot and you notice that your fingers or toes look pale or bluish.   This information is not intended to replace advice given to you by your health care provider. Make sure you discuss any questions you have with your health care provider.   Document Released: 10/11/2005 Document Revised: 02/25/2015 Document Reviewed: 10/07/2014 Elsevier Interactive Patient Education 2016 [employer]  Tissue Adhesive Wound Care Some cuts, wounds, lacerations, and incisions can be repaired by using tissue adhesive. Tissue adhesive is like glue. It holds  the skin together, allowing for faster healing. It forms a strong bond on the skin in about 1 minute and reaches its full strength in about 2 or 3 minutes. The adhesive disappears naturally while the wound is healing. It is important to take proper care of your wound at home while it heals.  HOME CARE INSTRUCTIONS   Showers are allowed. Do not soak the area containing the tissue adhesive. Do not take baths, swim, or use hot tubs. Do not use any soaps or ointments on the wound. Certain ointments can weaken the glue.  If a bandage (dressing) has  been applied, follow your health care provider's instructions for how often to change the dressing.   Keep the dressing dry if one has been applied.   Do not scratch, pick, or rub the adhesive.   Do not place tape over the adhesive. The adhesive could come off when pulling the tape off.   Protect the wound from further injury until it is healed.   Protect the wound from sun and tanning bed exposure while it is healing and for several weeks after healing.   Only take over-the-counter or prescription medicines as directed by your health care provider.   Keep all follow-up appointments as directed by your health care provider. SEEK IMMEDIATE MEDICAL CARE IF:   Your wound becomes red, swollen, hot, or tender.   You develop a rash after the glue is applied.  You have increasing pain in the wound.   You have a red streak that goes away from the wound.   You have pus coming from the wound.   You have increased bleeding.  You have a fever.  You have shaking chills.   You notice a bad smell coming from the wound.   Your wound or adhesive breaks open.  MAKE SURE YOU:   Understand these instructions.  Will watch your condition.  Will get help right away if you are not doing well or get worse.   This information is not intended to replace advice given to you by your health care provider. Make sure you discuss any questions you  have with your health care provider.   Document Released: 04/06/2001 Document Revised: 08/01/2013 Document Reviewed: 05/02/2013 Elsevier Interactive Patient Education 2016 [employer]  Contusion A contusion is a deep bruise. Contusions are the result of a blunt injury to tissues and muscle fibers under the skin. The injury causes bleeding under the skin. The skin overlying the contusion may turn blue, purple, or yellow. Minor injuries will give you a painless contusion, but more severe contusions may stay painful and swollen for a few weeks.  CAUSES  This condition is usually caused by a blow, trauma, or direct force to an area of the body. SYMPTOMS  Symptoms of this condition include:  Swelling of the injured area.  Pain and tenderness in the injured area.  Discoloration. The area may have redness and then turn blue, purple, or yellow. DIAGNOSIS  This condition is diagnosed based on a physical exam and medical history. An X-ray, CT scan, or MRI may be needed to determine if there are any associated injuries, such as broken bones (fractures). TREATMENT  Specific treatment for this condition depends on what area of the body was injured. In general, the best treatment for a contusion is resting, icing, applying pressure to (compression), and elevating the injured area. This is often called the RICE strategy. Over-the-counter anti-inflammatory medicines may also be recommended for pain control.  HOME CARE INSTRUCTIONS   Rest the injured area.  If directed, apply ice to the injured area:  Put ice in a plastic bag.  Place a towel between your skin and the bag.  Leave the ice on for 20 minutes, 2-3 times per day.  If directed, apply light compression to the injured area using an elastic bandage. Make sure the bandage is not wrapped too tightly. Remove and reapply the bandage as directed by your health care provider.  If possible, raise (elevate) the injured area above the level of  your heart while you are sitting or lying down.  Take over-the-counter and prescription medicines only as told by your health care provider. SEEK MEDICAL CARE IF:  Your symptoms do not improve after several days of treatment.  Your symptoms get worse.  You have difficulty moving the injured area. SEEK IMMEDIATE MEDICAL CARE IF:   You have severe pain.  You have numbness in a hand or foot.  Your hand or foot turns pale or cold.   This information is not intended to replace advice given to you by your health care provider. Make sure you discuss any questions you have with your health care provider.   Document Released: 07/21/2005 Document Revised: 07/02/2015 Document Reviewed: 02/26/2015 Elsevier Interactive Patient Education 2016 [employer]  Cephalexin tablets or capsules What is this medicine? CEPHALEXIN (sef a LEX in) is a cephalosporin antibiotic. It is used to treat certain kinds of bacterial infections It will not work for colds, flu, or other viral infections. This medicine may be used for other purposes; ask your health care provider or pharmacist if you have questions. What should I tell my health care provider before I take this medicine? They need to know if you have any of these conditions: -kidney disease -stomach or intestine problems, especially colitis -an unusual or allergic reaction to cephalexin, other cephalosporins, penicillins, other antibiotics, medicines, foods, dyes or preservatives -pregnant or trying to get pregnant -breast-feeding How should I use this medicine? Take this medicine by mouth with a full glass of water. Follow the directions on the prescription label. This medicine can be taken with or without food. Take your medicine at regular intervals. Do not take your medicine more often than directed. Take all of your medicine as directed even if you think you are better. Do not skip doses or stop your medicine early. Talk to your pediatrician  regarding the use of this medicine in children. While this drug may be prescribed for selected conditions, precautions do apply. Overdosage: If you think you have taken too much of this medicine contact a poison control center or emergency room at once. NOTE: This medicine is only for you. Do not share this medicine with others. What if I miss a dose? If you miss a dose, take it as soon as you can. If it is almost time for your next dose, take only that dose. Do not take double or extra doses. There should be at least 4 to 6 hours between doses. What may interact with this medicine? -probenecid -some other antibiotics This list may not describe all possible interactions. Give your health care provider a list of all the medicines, herbs, non-prescription drugs, or dietary supplements you use. Also tell them if you smoke, drink alcohol, or use illegal drugs. Some items may interact with your medicine. What should I watch for while using this medicine? Tell your doctor or health care professional if your symptoms do not begin to improve in a few days. Do not treat diarrhea with over the counter products. Contact your doctor if you have diarrhea that lasts more than 2 days or if it is severe and watery. If you have diabetes, you may get a false-positive result for sugar in your urine. Check with your doctor or health care professional. What side effects may I notice from receiving this medicine? Side effects that you should report to your doctor or health care professional as soon as possible: -allergic reactions like skin rash, itching or hives, swelling of the face, lips, or tongue -breathing problems -pain or trouble passing urine -redness,  blistering, peeling or loosening of the skin, including inside the mouth -severe or watery diarrhea -unusually weak or tired -yellowing of the eyes, skin Side effects that usually do not require medical attention (report to your doctor or health care  professional if they continue or are bothersome): -gas or heartburn -genital or anal irritation -headache -joint or muscle pain -nausea, vomiting This list may not describe all possible side effects. Call your doctor for medical advice about side effects. You may report side effects to FDA at 1-800-FDA-1088. Where should I keep my medicine? Keep out of the reach of children. Store at room temperature between 59 and 86 degrees F (15 and 30 degrees C). Throw away any unused medicine after the expiration date. NOTE: This sheet is a summary. It may not cover all possible information. If you have questions about this medicine, talk to your doctor, pharmacist, or health care provider.    2016, Elsevier/Gold Standard. (2008-01-15 17:09:13)  Naproxen and naproxen sodium oral immediate-release tablets What is this medicine? NAPROXEN (na PROX en) is a non-steroidal anti-inflammatory drug (NSAID). It is used to reduce swelling and to treat pain. This medicine may be used for dental pain, headache, or painful monthly periods. It is also used for painful joint and muscular problems such as arthritis, tendinitis, bursitis, and gout. This medicine may be used for other purposes; ask your health care provider or pharmacist if you have questions. What should I tell my health care provider before I take this medicine? They need to know if you have any of these conditions: -asthma -cigarette smoker -drink more than 3 alcohol containing drinks a day -heart disease or circulation problems such as heart failure or leg edema (fluid retention) -high blood pressure -kidney disease -liver disease -stomach bleeding or ulcers -an unusual or allergic reaction to naproxen, aspirin, other NSAIDs, other medicines, foods, dyes, or preservatives -pregnant or trying to get pregnant -breast-feeding How should I use this medicine? Take this medicine by mouth with a glass of water. Follow the directions on the  prescription label. Take it with food if your stomach gets upset. Try to not lie down for at least 10 minutes after you take it. Take your medicine at regular intervals. Do not take your medicine more often than directed. Long-term, continuous use may increase the risk of heart attack or stroke. A special MedGuide will be given to you by the pharmacist with each prescription and refill. Be sure to read this information carefully each time. Talk to your pediatrician regarding the use of this medicine in children. Special care may be needed. Overdosage: If you think you have taken too much of this medicine contact a poison control center or emergency room at once. NOTE: This medicine is only for you. Do not share this medicine with others. What if I miss a dose? If you miss a dose, take it as soon as you can. If it is almost time for your next dose, take only that dose. Do not take double or extra doses. What may interact with this medicine? -alcohol -aspirin -cidofovir -diuretics -lithium -methotrexate -other drugs for inflammation like ketorolac or prednisone -pemetrexed -probenecid -warfarin This list may not describe all possible interactions. Give your health care provider a list of all the medicines, herbs, non-prescription drugs, or dietary supplements you use. Also tell them if you smoke, drink alcohol, or use illegal drugs. Some items may interact with your medicine. What should I watch for while using this medicine? Tell your doctor or  health care professional if your pain does not get better. Talk to your doctor before taking another medicine for pain. Do not treat yourself. This medicine does not prevent heart attack or stroke. In fact, this medicine may increase the chance of a heart attack or stroke. The chance may increase with longer use of this medicine and in people who have heart disease. If you take aspirin to prevent heart attack or stroke, talk with your doctor or health care  professional. Do not take other medicines that contain aspirin, ibuprofen, or naproxen with this medicine. Side effects such as stomach upset, nausea, or ulcers may be more likely to occur. Many medicines available without a prescription should not be taken with this medicine. This medicine can cause ulcers and bleeding in the stomach and intestines at any time during treatment. Do not smoke cigarettes or drink alcohol. These increase irritation to your stomach and can make it more susceptible to damage from this medicine. Ulcers and bleeding can happen without warning symptoms and can cause death. You may get drowsy or dizzy. Do not drive, use machinery, or do anything that needs mental alertness until you know how this medicine affects you. Do not stand or sit up quickly, especially if you are an older patient. This reduces the risk of dizzy or fainting spells. This medicine can cause you to bleed more easily. Try to avoid damage to your teeth and gums when you brush or floss your teeth. What side effects may I notice from receiving this medicine? Side effects that you should report to your doctor or health care professional as soon as possible: -black or bloody stools, blood in the urine or vomit -blurred vision -chest pain -difficulty breathing or wheezing -nausea or vomiting -severe stomach pain -skin rash, skin redness, blistering or peeling skin, hives, or itching -slurred speech or weakness on one side of the body -swelling of eyelids, throat, lips -unexplained weight gain or swelling -unusually weak or tired -yellowing of eyes or skin Side effects that usually do not require medical attention (report to your doctor or health care professional if they continue or are bothersome): -constipation -headache -heartburn This list may not describe all possible side effects. Call your doctor for medical advice about side effects. You may report side effects to FDA at 1-800-FDA-1088. Where  should I keep my medicine? Keep out of the reach of children. Store at room temperature between 15 and 30 degrees C (59 and 86 degrees F). Keep container tightly closed. Throw away any unused medicine after the expiration date. NOTE: This sheet is a summary. It may not cover all possible information. If you have questions about this medicine, talk to your doctor, pharmacist, or health care provider.    2016, [employer] Standard. (2009-10-13 20:10:16)  Tramadol tablets What is this medicine? TRAMADOL (TRA ma dole) is a pain reliever. It is used to treat moderate to severe pain in adults. This medicine may be used for other purposes; ask your health care provider or pharmacist if you have questions. What should I tell my health care provider before I take this medicine? They need to know if you have any of these conditions: -brain tumor -depression -drug abuse or addiction -head injury -if you frequently drink alcohol containing drinks -kidney disease or trouble passing urine -liver disease -lung disease, asthma, or breathing problems -seizures or epilepsy -suicidal thoughts, plans, or attempt; a previous suicide attempt by you or a family member -an unusual or allergic reaction to tramadol, codeine,  other medicines, foods, dyes, or preservatives -pregnant or trying to get pregnant -breast-feeding How should I use this medicine? Take this medicine by mouth with a full glass of water. Follow the directions on the prescription label. If the medicine upsets your stomach, take it with food or milk. Do not take more medicine than you are told to take. Talk to your pediatrician regarding the use of this medicine in children. Special care may be needed. Overdosage: If you think you have taken too much of this medicine contact a poison control center or emergency room at once. NOTE: This medicine is only for you. Do not share this medicine with others. What if I miss a dose? If you miss a  dose, take it as soon as you can. If it is almost time for your next dose, take only that dose. Do not take double or extra doses. What may interact with this medicine? Do not take this medicine with any of the following medications: -MAOIs like Carbex, Eldepryl, Marplan, Nardil, and Parnate This medicine may also interact with the following medications: -alcohol or medicines that contain alcohol -antihistamines -benzodiazepines -bupropion -carbamazepine or oxcarbazepine -clozapine -cyclobenzaprine -digoxin -furazolidone -linezolid -medicines for depression, anxiety, or psychotic disturbances -medicines for migraine headache like almotriptan, eletriptan, frovatriptan, naratriptan, rizatriptan, sumatriptan, zolmitriptan -medicines for pain like pentazocine, buprenorphine, butorphanol, meperidine, nalbuphine, and propoxyphene -medicines for sleep -muscle relaxants -naltrexone -phenobarbital -phenothiazines like perphenazine, thioridazine, chlorpromazine, mesoridazine, fluphenazine, prochlorperazine, promazine, and trifluoperazine -procarbazine -warfarin This list may not describe all possible interactions. Give your health care provider a list of all the medicines, herbs, non-prescription drugs, or dietary supplements you use. Also tell them if you smoke, drink alcohol, or use illegal drugs. Some items may interact with your medicine. What should I watch for while using this medicine? Tell your doctor or health care professional if your pain does not go away, if it gets worse, or if you have new or a different type of pain. You may develop tolerance to the medicine. Tolerance means that you will need a higher dose of the medicine for pain relief. Tolerance is normal and is expected if you take this medicine for a long time. Do not suddenly stop taking your medicine because you may develop a severe reaction. Your body becomes used to the medicine. This does NOT mean you are addicted.  Addiction is a behavior related to getting and using a drug for a non-medical reason. If you have pain, you have a medical reason to take pain medicine. Your doctor will tell you how much medicine to take. If your doctor wants you to stop the medicine, the dose will be slowly lowered over time to avoid any side effects. You may get drowsy or dizzy. Do not drive, use machinery, or do anything that needs mental alertness until you know how this medicine affects you. Do not stand or sit up quickly, especially if you are an older patient. This reduces the risk of dizzy or fainting spells. Alcohol can increase or decrease the effects of this medicine. Avoid alcoholic drinks. You may have constipation. Try to have a bowel movement at least every 2 to 3 days. If you do not have a bowel movement for 3 days, call your doctor or health care professional. Your mouth may get dry. Chewing sugarless gum or sucking hard candy, and drinking plenty of water may help. Contact your doctor if the problem does not go away or is severe. What side effects may I notice from receiving  this medicine? Side effects that you should report to your doctor or health care professional as soon as possible: -allergic reactions like skin rash, itching or hives, swelling of the face, lips, or tongue -breathing difficulties, wheezing -confusion -itching -light headedness or fainting spells -redness, blistering, peeling or loosening of the skin, including inside the mouth -seizures Side effects that usually do not require medical attention (report to your doctor or health care professional if they continue or are bothersome): -constipation -dizziness -drowsiness -headache -nausea, vomiting This list may not describe all possible side effects. Call your doctor for medical advice about side effects. You may report side effects to FDA at 1-800-FDA-1088. Where should I keep my medicine? Keep out of the reach of children. This medicine  may cause accidental overdose and death if it taken by other adults, children, or pets. Mix any unused medicine with a substance like cat litter or coffee grounds. Then throw the medicine away in a sealed container like a sealed bag or a coffee can with a lid. Do not use the medicine after the expiration date. Store at room temperature between 15 and 30 degrees C (59 and 86 degrees F). NOTE: This sheet is a summary. It may not cover all possible information. If you have questions about this medicine, talk to your doctor, pharmacist, or health care provider.    2016, Elsevier/Gold Standard. (2013-12-07 15:42:09)
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
## 2016-03-10 NOTE — ED Notes (Signed)
Lidocaine at cart and ready
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
## 2016-03-15 ENCOUNTER — Emergency Department (HOSPITAL_COMMUNITY)
Admission: EM | Admit: 2016-03-15 | Discharge: 2016-03-16 | Disposition: A | Discharge status: Home / Self Care | Attending: Emergency Medicine | Admitting: Emergency Medicine

## 2016-03-15 ENCOUNTER — Encounter (HOSPITAL_COMMUNITY): Payer: Self-pay

## 2016-03-15 DIAGNOSIS — Z760 Encounter for issue of repeat prescription: Secondary | ICD-10-CM | POA: Insufficient documentation

## 2016-03-15 DIAGNOSIS — Z5189 Encounter for other specified aftercare: Secondary | ICD-10-CM

## 2016-03-15 DIAGNOSIS — Z79899 Other long term (current) drug therapy: Secondary | ICD-10-CM | POA: Insufficient documentation

## 2016-03-15 DIAGNOSIS — Z87891 Personal history of nicotine dependence: Secondary | ICD-10-CM | POA: Insufficient documentation

## 2016-03-15 DIAGNOSIS — Z4801 Encounter for change or removal of surgical wound dressing: Secondary | ICD-10-CM | POA: Insufficient documentation

## 2016-03-15 MED ORDER — CEPHALEXIN 500 MG PO CAPS
500.0000 mg | ORAL_CAPSULE | Freq: Once | ORAL | Status: AC
  Administered 2016-03-15: 500 mg via ORAL
  Filled 2016-03-15: qty 1

## 2016-03-15 MED ORDER — HYDROCODONE-ACETAMINOPHEN 5-325 MG PO TABS
1.0000 | ORAL_TABLET | Freq: Once | ORAL | Status: AC
  Administered 2016-03-15: 1 via ORAL
  Filled 2016-03-15: qty 1
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
## 2016-03-15 NOTE — Discharge Instructions (Signed)
It is important that you call the hand surgeon on your original paperwork and take the antibiotic as directed

## 2016-03-16 MED ORDER — CEPHALEXIN 500 MG PO CAPS: 500.0000 mg | ORAL_CAPSULE | Freq: Three times a day (TID) | ORAL | Status: AC

## 2016-03-16 MED ORDER — NAPROXEN 500 MG PO TABS: 500.0000 mg | ORAL_TABLET | Freq: Two times a day (BID) | ORAL | Status: AC

## 2016-03-16 NOTE — ED Notes (Addendum)
Patient was given prescriptions for Keflex and naproxen.  Patient upset that he did not get prescription for Ultram.  Explained to patient that his last ER visit was five days ago, and the same prescriptions would not be written aside from antibiotics.  Patient visibly upset and demanding prescription for ultram.  Patient states he has been taking his mothers medication and "pills off the street" for pain relief.  Patient is not in any obvious pain during this visit.  Patient appears manic with flight of ideas during conversation. Patient's father at bedside helped to calm patient and patient agreed to sign out.  Patient refused to have vitals taken.

## 2016-10-11 ENCOUNTER — Emergency Department (HOSPITAL_COMMUNITY): Payer: Self-pay

## 2016-10-11 ENCOUNTER — Encounter (HOSPITAL_COMMUNITY): Payer: Self-pay | Admitting: Emergency Medicine

## 2016-10-11 ENCOUNTER — Emergency Department (HOSPITAL_COMMUNITY)
Admission: EM | Admit: 2016-10-11 | Discharge: 2016-10-11 | Disposition: A | Discharge status: Home / Self Care | Payer: Self-pay | Attending: Emergency Medicine | Admitting: Emergency Medicine

## 2016-10-11 DIAGNOSIS — R112 Nausea with vomiting, unspecified: Secondary | ICD-10-CM | POA: Insufficient documentation

## 2016-10-11 DIAGNOSIS — Z87891 Personal history of nicotine dependence: Secondary | ICD-10-CM | POA: Insufficient documentation

## 2016-10-11 DIAGNOSIS — R1084 Generalized abdominal pain: Secondary | ICD-10-CM | POA: Insufficient documentation

## 2016-10-11 LAB — COMPREHENSIVE METABOLIC PANEL
ALK PHOS: 43 U/L (ref 38–126)
ALT: 21 U/L (ref 17–63)
ANION GAP: 12 (ref 5–15)
AST: 25 U/L (ref 15–41)
Albumin: 4.2 g/dL (ref 3.5–5.0)
BILIRUBIN TOTAL: 0.8 mg/dL (ref 0.3–1.2)
BUN: 9 mg/dL (ref 6–20)
CALCIUM: 9.8 mg/dL (ref 8.9–10.3)
CO2: 22 mmol/L (ref 22–32)
Chloride: 105 mmol/L (ref 101–111)
Creatinine, Ser: 0.9 mg/dL (ref 0.61–1.24)
GFR calc non Af Amer: >60 mL/min (ref >60)
Glucose, Bld: 152 mg/dL — ABNORMAL HIGH (ref 65–99)
POTASSIUM: 4.1 mmol/L (ref 3.5–5.1)
SODIUM: 139 mmol/L (ref 135–145)
TOTAL PROTEIN: 7 g/dL (ref 6.5–8.1)

## 2016-10-11 LAB — CBC WITH DIFFERENTIAL/PLATELET
Basophils Absolute: 0 10*3/uL (ref 0.0–0.1)
Basophils Relative: 0 %
EOS ABS: 0.1 10*3/uL (ref 0.0–0.7)
Eosinophils Relative: 1 %
HCT: 42.9 % (ref 39.0–52.0)
HEMOGLOBIN: 15.1 g/dL (ref 13.0–17.0)
LYMPHS ABS: 2.8 10*3/uL (ref 0.7–4.0)
Lymphocytes Relative: 26 %
MCH: 29.4 pg (ref 26.0–34.0)
MCHC: 35.2 g/dL (ref 30.0–36.0)
MCV: 83.6 fL (ref 78.0–100.0)
MONO ABS: 0.7 10*3/uL (ref 0.1–1.0)
MONOS PCT: 6 %
NEUTROS PCT: 67 %
Neutro Abs: 7.1 10*3/uL (ref 1.7–7.7)
Platelets: 234 10*3/uL (ref 150–400)
RBC: 5.13 MIL/uL (ref 4.22–5.81)
RDW: 13 % (ref 11.5–15.5)
WBC: 10.6 10*3/uL — ABNORMAL HIGH (ref 4.0–10.5)

## 2016-10-11 LAB — URINALYSIS, ROUTINE W REFLEX MICROSCOPIC
BILIRUBIN URINE: NEGATIVE
GLUCOSE, UA: NEGATIVE mg/dL
HGB URINE DIPSTICK: NEGATIVE
Ketones, ur: NEGATIVE mg/dL
Leukocytes, UA: NEGATIVE
NITRITE: NEGATIVE
PH: 9 — AB (ref 5.0–8.0)
Protein, ur: 100 mg/dL — AB
SPECIFIC GRAVITY, URINE: 1.02 (ref 1.005–1.030)
Squamous Epithelial / LPF: NONE SEEN

## 2016-10-11 LAB — LIPASE, BLOOD: Lipase: 18 U/L (ref 11–51)

## 2016-10-11 MED ORDER — METOCLOPRAMIDE HCL 5 MG/ML IJ SOLN
10.0000 mg | Freq: Once | INTRAMUSCULAR | Status: AC
  Administered 2016-10-11: 10 mg via INTRAVENOUS
  Filled 2016-10-11: qty 2

## 2016-10-11 MED ORDER — ONDANSETRON 8 MG PO TBDP: 8.0000 mg | ORAL_TABLET | Freq: Three times a day (TID) | ORAL | 0 refills | Status: AC | PRN

## 2016-10-11 MED ORDER — PANTOPRAZOLE SODIUM 40 MG IV SOLR
80.0000 mg | Freq: Once | INTRAVENOUS | Status: AC
  Administered 2016-10-11: 80 mg via INTRAVENOUS
  Filled 2016-10-11: qty 80

## 2016-10-11 MED ORDER — ONDANSETRON HCL 4 MG/2ML IJ SOLN
4.0000 mg | Freq: Once | INTRAMUSCULAR | Status: AC
  Administered 2016-10-11: 4 mg via INTRAVENOUS
  Filled 2016-10-11: qty 2

## 2016-10-11 MED ORDER — LORAZEPAM 2 MG/ML IJ SOLN
0.5000 mg | Freq: Once | INTRAMUSCULAR | Status: AC
  Administered 2016-10-11: 0.5 mg via INTRAVENOUS
  Filled 2016-10-11: qty 1

## 2016-10-11 MED ORDER — SODIUM CHLORIDE 0.9 % IV BOLUS (SEPSIS)
1000.0000 mL | Freq: Once | INTRAVENOUS | Status: AC
  Administered 2016-10-11: 1000 mL via INTRAVENOUS

## 2016-10-11 MED ORDER — IOPAMIDOL (ISOVUE-300) INJECTION 61%
INTRAVENOUS | Status: AC
  Administered 2016-10-11: 75 mL
  Filled 2016-10-11: qty 100

## 2016-10-11 NOTE — ED Provider Notes (Signed)
[facility]-EMERGENCY DEPT Provider Note   CSN: [id number] Arrival date & time: 10/11/16  0700     History   Chief Complaint Chief Complaint  Patient presents with  . Abdominal Pain    HPI Norman Perez is a 25 y.o. male.  HPI  25 rolled male presents today complaining of onset of diffuse abdominal pain with nausea, vomiting, and diarrhea. He states he has had several similar episodes in the past. No definite etiology has been found. He states that he was well when he went to bed last night. Denies fever or chills. Denies urinary symptoms, urethral discharge, or testicular pain. Has vomited 3 times and had 3 loose bowel movements. He decided denies any hematemesis or bile.   Past Medical History:  Diagnosis Date  . History of MRSA infection 12/2010   right hand  . Jaw fracture (HCC)   . 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 Retained orthopedic hardware 10/2012   jaw    There are no active problems to display for this patient.   Past Surgical History:  Procedure Laterality Date  . FINGER DEBRIDEMENT  01/08/2011   right long finger; I & D flexor tendon sheath  . MANDIBULAR HARDWARE REMOVAL  11/10/2012   Procedure: MANDIBULAR HARDWARE REMOVAL;  Surgeon: Christia Reading, MD;  Location: [facility];  Service: ENT;  Laterality: Bilateral;  . ORIF MANDIBULAR FRACTURE  09/17/2012   Procedure: OPEN REDUCTION INTERNAL FIXATION (ORIF) MANDIBULAR FRACTURE;  Surgeon: Christia Reading, MD;  Location: [facility] OR;  Service: ENT;  Laterality: N/A;  orif mandible fx and MMF  . TRIGGER FINGER RELEASE  01/08/2011   right long finger       Home Medications    Prior to Admission medications   Medication Sig Start Date End Date Taking? Authorizing Provider  cephALEXin (KEFLEX) 500 MG capsule Take 1 capsule (500 mg total) by mouth 3 (three) times daily. Patient not taking: Reported on 10/11/2016 03/16/16   Earley Favor, NP  naproxen (NAPROSYN) 500 MG tablet Take 1 tablet (500 mg  total) by mouth 2 (two) times daily. Patient not taking: Reported on 10/11/2016 03/16/16   Earley Favor, NP  traMADol (ULTRAM) 50 MG tablet Take 1 tablet (50 mg total) by mouth every 6 (six) hours as needed. Patient not taking: Reported on 10/11/2016 03/10/16   Dione Booze, MD    Family History No family history on file.  Social History Social History  Substance Use Topics  . Smoking status: Former Smoker    Years: 6.00    Types: Cigarettes    Quit date: 05/08/2013  . Smokeless tobacco: Never Used     Comment: 7-8 cig./day  . Alcohol use Yes     Comment: occasionally     Allergies   Patient has no known allergies.   Review of Systems Review of Systems  All other systems reviewed and are negative.    Physical Exam Updated Vital Signs BP 106/70   Pulse 78   Temp 98.4 F (36.9 C) (Oral)   Resp 24   Ht 6\' 1"  (1.854 m)   Wt 73 kg   SpO2 100%   BMI 21.24 kg/m   Physical Exam  Constitutional: He is oriented to person, place, and time. He appears well-developed and well-nourished. He appears distressed.  HENT:  Head: Normocephalic and atraumatic.  Eyes: EOM are normal. Pupils are equal, round, and reactive to light.  Neck: Normal range of motion. Neck supple.  Cardiovascular: Normal rate, regular rhythm, normal heart sounds and intact distal pulses.   Pulmonary/Chest: Effort normal and breath sounds normal.  Abdominal:  Moderate diffuse tenderness palpation no point tenderness noted  Musculoskeletal: Normal range of motion.  Neurological: He is alert and oriented to person, place, and time.  Skin: Skin is warm and dry. Capillary refill takes less than 2 seconds.  Psychiatric: He has a normal mood and affect.  Nursing note and vitals reviewed.    ED Treatments / Results  Labs (all labs ordered are listed, but only abnormal results are displayed) Labs Reviewed  CBC WITH DIFFERENTIAL/PLATELET - Abnormal; Notable for the following:       Result Value   WBC 10.6  (*)    All other components within normal limits  COMPREHENSIVE METABOLIC PANEL - Abnormal; Notable for the following:    Glucose, Bld 152 (*)    All other components within normal limits  URINALYSIS, ROUTINE W REFLEX MICROSCOPIC - Abnormal; Notable for the following:    APPearance HAZY (*)    pH 9.0 (*)    Protein, ur 100 (*)    Bacteria, UA RARE (*)    All other components within normal limits  LIPASE, BLOOD    EKG  EKG Interpretation None       Radiology Ct Abdomen Pelvis W Contrast  Result Date: 10/11/2016 CLINICAL DATA:  Lower abdominal pain with vomiting and diarrhea. EXAM: CT ABDOMEN AND PELVIS WITH CONTRAST TECHNIQUE: Multidetector CT imaging of the abdomen and pelvis was performed using the standard protocol following bolus administration of intravenous contrast. CONTRAST:  75mL ISOVUE-300 IOPAMIDOL (ISOVUE-300) INJECTION 61% COMPARISON:  07/27/2009. FINDINGS: Lower chest: No acute findings. Heart size normal. No pericardial or pleural effusion. Hepatobiliary: Liver and gallbladder are unremarkable. No biliary ductal dilatation. Pancreas: Negative. Spleen: Negative. Adrenals/Urinary Tract: Adrenal glands and kidneys are unremarkable. Ureters are decompressed. Bladder is grossly unremarkable. Stomach/Bowel: Stomach, small bowel, appendix and colon are unremarkable. Vascular/Lymphatic: Vascular structures are unremarkable. No pathologically enlarged lymph nodes. Reproductive: Prostate is normal in size. Other: Small pelvic free fluid. Musculoskeletal: No worrisome lytic or sclerotic lesions. IMPRESSION: 1. No findings to explain the patient's symptoms. No evidence of appendicitis. 2. Small pelvic free fluid. Electronically Signed   By: Leanna BattlesMelinda  Blietz M.D.   On: 10/11/2016 11:18    Procedures Procedures (including critical care time)  Medications Ordered in ED Medications  sodium chloride 0.9 % bolus 1,000 mL (1,000 mLs Intravenous New Bag/Given 10/11/16 0751)  metoCLOPramide  (REGLAN) injection 10 mg (10 mg Intravenous Given 10/11/16 0751)  ondansetron (ZOFRAN) injection 4 mg (4 mg Intravenous Given 10/11/16 0751)  pantoprazole (PROTONIX) 80 mg in sodium chloride 0.9 % 100 mL IVPB (0 mg Intravenous Stopped 10/11/16 0952)  LORazepam (ATIVAN) injection 0.5 mg (0.5 mg Intravenous Given 10/11/16 0955)  iopamidol (ISOVUE-300) 61 % injection (75 mLs  Contrast Given 10/11/16 1058)     Initial Impression / Assessment and Plan / ED Course  I have reviewed the triage vital signs and the nursing notes.  Pertinent labs & imaging results that were available during my care of the patient were reviewed by me and considered in my medical decision making (see chart for details).  Clinical Course   Patient received pain medicine. He got up and fell to the floor at one point. He did not lose consciousness and was not lightheaded. He denies any injury from this. He received some Ativan afterwards that has been resting culturally. He has tolerated by mouth fluids  without difficulty. CT abdomen shows no acute changes. Patient is discharged home in improved condition. He is advised regarding return precautions. Final Clinical Impressions(s) / ED Diagnoses   Final diagnoses:  Generalized abdominal pain  Nausea and vomiting, intractability of vomiting not specified, unspecified vomiting type    New Prescriptions New Prescriptions   No medications on file     Margarita Grizzleanielle Yentl Verge, MD 10/11/16 1251

## 2016-10-11 NOTE — ED Notes (Addendum)
Patient found sitting in the hallway outside of his room, pt stated that he unhooked himself to go to the restroom and got dizzy and sat himself down against the wall. Patient helped back to stretcher by ED staff, Dr. Rosalia Hammersay made aware of situation. Pt appearing very restless and unable to lie still on stretcher. Pt instructed not to get out of bed without assistance from ED staff. Provided urinal at bedside

## 2016-10-11 NOTE — ED Notes (Signed)
Water given to patient per order Dr. Rosalia Hammersay

## 2016-10-11 NOTE — ED Notes (Signed)
Patient appears very restless, unable to lie still on stretcher

## 2016-10-11 NOTE — ED Triage Notes (Signed)
Pt reports lower abdominal pain that began this am, vomiting and diarrhea associated with pain. Pt denies any urinary symptoms and reports hx of same kind of pain with GI virus in the past.

## 2022-11-13 ENCOUNTER — Emergency Department (HOSPITAL_COMMUNITY): Payer: No Typology Code available for payment source

## 2022-11-13 ENCOUNTER — Other Ambulatory Visit (HOSPITAL_BASED_OUTPATIENT_CLINIC_OR_DEPARTMENT_OTHER): Payer: Self-pay

## 2022-11-13 ENCOUNTER — Emergency Department
Admission: EM | Admit: 2022-11-13 | Discharge: 2022-11-13 | Disposition: A | Discharge status: Home / Self Care | Payer: No Typology Code available for payment source | Attending: Nurse Practitioner | Admitting: Nurse Practitioner

## 2022-11-13 DIAGNOSIS — M79671 Pain in right foot: Secondary | ICD-10-CM | POA: Insufficient documentation

## 2022-11-13 DIAGNOSIS — Z5900 Homelessness unspecified: Secondary | ICD-10-CM | POA: Insufficient documentation

## 2022-11-13 MED ORDER — DICLOFENAC SODIUM 1 % EX GEL
2.0000 g | Freq: Four times a day (QID) | CUTANEOUS | 0 refills | Status: AC
Start: 2022-11-13 — End: ?
  Filled 2022-11-13 (×2): qty 45, 6d supply, fill #0

## 2022-11-13 MED ORDER — METHOCARBAMOL 500 MG OR TABS
500.0000 mg | ORAL_TABLET | Freq: Four times a day (QID) | ORAL | 0 refills | Status: AC | PRN
Start: 2022-11-13 — End: ?
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
## 2022-11-13 NOTE — ED Notes (Signed)
Patient handed dc papers.  Instructed to fill paper script (methocaramol) and that other script was sent down to the dc pharmacy.  Patient got upset with this.  Patient states "that is a little sketchy because it does not have my name on it".  RN offered to get physical script for the diclofenac.  Patient states he doesn't want any other meds.  Ambulatory out the door. Gait steady. Declined final vitals.      Barrie Dunker, RN  11/13/22 [phone]

## 2022-11-13 NOTE — ED Notes (Signed)
Bed: [facility]  Expected date:   Expected time:   Means of arrival:   Comments:  CRASH CART     Franz Dell, RN  11/13/22 [phone]

## 2022-11-13 NOTE — ED Notes (Signed)
Patient brought back to RME 1.  When asked what is going on he states "a lot". Endorses relapse with meth, restless leg syndrome, ststes he is unable to feel his right leg but endorses pain in that R leg.  Patient ambulatory from front triage into RME 1     Barrie Dunker, RN  11/13/22 [phone]

## 2022-11-13 NOTE — Discharge Instructions (Addendum)
We  ordered x-ray as you had requested  but you refused.  We have prescribed muscle relaxant and a pain cream to your right foot.  if symptoms not improve come back to the emergency room or establish care with a primary care doctor

## 2022-11-13 NOTE — ED Triage Notes (Signed)
72m  ambulates from front triage c/o a URI sysptoms

## 2022-11-13 NOTE — ED Provider Notes (Signed)
CHIEF COMPLAINT   Chief Complaint   Patient presents with    Foot Pain            HISTORY OF PRESENT ILLNESS AND REVIEW OF SYSTEMS        Patient is a 32 year old male poor historian with history of mental illness and substance use disorder presents to the emergency room complaining of  atraumatic right foot pain in the setting of prolonged walking and not sleeping for days.  He wants it checked out.  States he has been snorting and smoking methamphetamine since he was released from [employer].  States he normally snorts opiates and methamphetamine but smoked meth for the first time yesterday , last used few hours prior to arrival    He is  somewhat evasive and paranoid, intermittently irritable with some questions       PAST MEDICAL AND SURGICAL HISTORY   No past medical history on file.    No past surgical history on file.       MEDICATIONS AND ALLERGIES     OUTPATIENT MEDICATIONS:   Current Outpatient Medications   Medication Instructions    diclofenac 1 % gel Apply 2 g topically 4 times a day. Apply to affected area.    methocarbamol (ROBAXIN) 500 mg, Oral, Every 6 hours PRN       ALLERGIES:   Patient has no known allergies.            SOCIAL HISTORY AND FAMILY HISTORY      Homeless  SUD  Family History       No data available                  PHYSICAL EXAM   ED VITALS:  Vitals (Arrival)      T: 36.8 C (11/13/22 0503)  BP: 130/84 (11/13/22 0504)  HR: 90 (11/13/22 0503)  RR: 20 (11/13/22 0503)  SpO2: 95 % (11/13/22 0503)     Vitals (Most recent in last 24 hrs)   T: 36.7 C (11/13/22 0731)  BP: (!) 134/93 (11/13/22 0731)  HR: 87 (11/13/22 0731)  RR: 18 (11/13/22 0731)  SpO2: 99 % (11/13/22 0731)    T range: Temp  Min: 36.7 C  Max: 36.8 C  (no weight taken for this visit)     (no height taken for this visit)     There is no height or weight on file to calculate BMI.       Physical Exam  Neurological:      Mental Status: He is alert.   Psychiatric:         Mood and Affect: Affect is flat.          Speech: Speech is tangential.         Thought Content: Thought content is paranoid. Thought content does not include homicidal or suicidal ideation.     MSK No obvious deformity.  No erythema, edema, ecchymosis, or broken skin.  No effusion.  Full ROM intact.  No crepitus.  No point tenderness o  Negative anterior/posterior drawer sign.   2+ DP and PT pulses. Sensation of LE grossly intact.  Motor strength 5/5          LABORATORY:   Labs Reviewed - No data to display      IMAGING:     ED Wet Read -   XR Foot 3 View Right    (Results Pending)     Refused after he requested to do x ray  though I had told him, it was not indicated  Radiology Final Result -   No image results found.              EKG DOCUMENTATION                 SUICIDE RISK EVALUATION             SEPSIS               ED COURSE/MEDICAL DECISION MAKING              Patient 32 year old male with history of mental illness  and substance use disorder presents to emergency room requesting x-ray for his right foot in the setting of an alleged prolonged walking and not able to sleep. No trauma    On arrival he appears paranoid otherwise (snorted meths, prior to arrival) appears systemically well vital signs stable room air  Gets easily  irritable.  Right foot exam  with no red flags however,  given concern we agreed to get xray.  But when he was taken to xray he declined    I was unable to  get record from [place]  Discharged with strict return to ED precaution         External records review : Other no chart to review                                  Medications Given in the ED:   Medications - No data to display           CLINICAL IMPRESSION AND DISPOSITION (Link)     Clinical Impressions:   [M76.720] Right foot pain        No follow-up provider specified.    Patient was given scripts for the following medications.  Discharge Medication List as of 11/13/2022  8:58 AM        START taking these medications    Details   diclofenac 1 % gel Apply 2 g  topically 4 times a day. Apply to to affected area.Disp-45 g, R-0, Normal      methocarbamol 500 MG tablet Take 1 tablet (500 mg) by mouth every 6 hours as needed for muscle spasms.Disp-45 tablet, R-0, Print                Disposition: Discharge        CRITICAL CARE/ADDITIONAL [place], [place], [place]  11/13/22 1159

## 2022-11-13 NOTE — ED Notes (Signed)
Patient getting more irritated with questioning.  When asked about detox patient repeatedly declining. When asked about his social hx he was upset and asked staff not to question any more about "things that don't matter"     Barrie Dunker, RN  11/13/22 [phone]

## 2022-11-17 ENCOUNTER — Emergency Department
Admission: EM | Admit: 2022-11-17 | Discharge: 2022-11-17 | Discharge status: Against Medical Advice | Payer: No Typology Code available for payment source

## 2022-11-17 DIAGNOSIS — Z711 Person with feared health complaint in whom no diagnosis is made: Secondary | ICD-10-CM | POA: Insufficient documentation

## 2022-11-18 ENCOUNTER — Encounter (HOSPITAL_COMMUNITY): Payer: Self-pay

## 2022-11-18 ENCOUNTER — Other Ambulatory Visit: Payer: Self-pay

## 2022-11-18 ENCOUNTER — Emergency Department
Admission: EM | Admit: 2022-11-18 | Discharge: 2022-11-18 | Disposition: A | Discharge status: Home / Self Care | Payer: No Typology Code available for payment source | Source: Home / Self Care | Attending: Nurse Practitioner | Admitting: Nurse Practitioner

## 2022-11-18 DIAGNOSIS — Z5900 Homelessness unspecified: Secondary | ICD-10-CM

## 2022-11-18 DIAGNOSIS — F99 Mental disorder, not otherwise specified: Secondary | ICD-10-CM | POA: Insufficient documentation

## 2022-11-18 DIAGNOSIS — M79672 Pain in left foot: Secondary | ICD-10-CM | POA: Insufficient documentation

## 2022-11-18 DIAGNOSIS — Z5902 Unsheltered homelessness: Secondary | ICD-10-CM

## 2022-11-18 DIAGNOSIS — M79671 Pain in right foot: Secondary | ICD-10-CM | POA: Insufficient documentation

## 2022-11-18 NOTE — Progress Notes (Signed)
ED Social Work Assessment    ID / CC / Reason for Referral: Patient is a 32 YO male seen in [place] for c/o foot pain. Patient is referred to EDSW for shoes.       Identifying data/reason for referral:  Referral Source: Physician  Referral Reason: Information/Resources    Social Work Summary:  HPI: EDSW provides the patient with a pair of sandals and socks.    Social History:    Living situation prior to admit: Homeless         Interpreter: No     Impression:  Patient has Nurse, mental health needs.    Plan: Patient discharged to street          Sena Hitch, [facility]   Emergency Services Social Worker  [facility]  [phone]  SW Emergency department services (minutes): 20

## 2022-11-18 NOTE — Discharge Instructions (Addendum)
Please sleep in shelters  Your feet are not infected

## 2022-11-18 NOTE — ED Provider Notes (Signed)
CHIEF COMPLAINT   Chief Complaint   Patient presents with    Foot Pain            HISTORY OF PRESENT ILLNESS AND REVIEW OF SYSTEMS      32 year old homeless male with history of polysubstance use and mental illness presents reporting bilateral foot pain and requesting antibiotics.  Denies trauma. Not staying in shelters, also wants food and shoes. No SI or HI         PAST MEDICAL AND SURGICAL HISTORY   No past medical history on file.    No past surgical history on file.  Sud  Mental illness     MEDICATIONS AND ALLERGIES     OUTPATIENT MEDICATIONS:     Current Outpatient Medications   Medication Instructions    diclofenac 1 % gel Apply 2 g topically 4 times a day. Apply to affected area.    methocarbamol (ROBAXIN) 500 mg, Oral, Every 6 hours PRN       ALLERGIES:   Patient has no known allergies.              SOCIAL HISTORY AND FAMILY HISTORY        Family History       No data available                  PHYSICAL EXAM   ED VITALS:  Vitals (Arrival)      T: 36.6 C (11/18/22 1335)  BP: 126/82 (11/18/22 1335)  HR: 82 (11/18/22 1335)  RR: 16 (11/18/22 1335)  SpO2: 97 % (11/18/22 1335)     Vitals (Most recent in last 24 hrs)   T: 36.6 C (11/18/22 1335)  BP: 126/82 (11/18/22 1335)  HR: 82 (11/18/22 1335)  RR: 16 (11/18/22 1335)  SpO2: 97 % (11/18/22 1335)    T range: Temp  Min: 36.6 C  Max: 36.6 C  (no weight taken for this visit)     (no height taken for this visit)     There is no height or weight on file to calculate BMI.       Physical Exam  Const: No acute distress, well-nourished, well-developed.  Head: Atraumatic. Normocephalic.  EXB:MWUXL mucous membranes.  CV: skin warm and dry, well perfused, rrr. Dp and dp and cap refill intact bilat   Resp: Normal work of breathing speaking full sentences  MSK:  Moving all extremities.  Skin: Warm and dry. No rashes or lesions to exposed skin  Feet and toes look great, no erythema or skin openings  Neuro: Normal facial symmetry, speech, gait.  Psych: Alert & Oriented x 3.  Appropriate mood and affect          LABORATORY:   Labs Reviewed - No data to display      IMAGING:     ED Wet Read -   No orders to display       Radiology Final Result -   No image results found.              EKG DOCUMENTATION                 SUICIDE RISK EVALUATION             SEPSIS               ED COURSE/MEDICAL DECISION MAKING      32 yo male presents with atraumatic bilat foot pain.    Patient is well appearing. No evidence of infection,  Neurovascularly intact.   Suspect patients feet hurt due to overuse.  Shoes provided and bus ticket to shelter.   Recommend primary care.  Patient does not appear to be experiencing a psychiatric crisis.                                                   Medications Given in the ED:   Medications - No data to display           CLINICAL IMPRESSION AND DISPOSITION (Link)     Clinical Impressions:   [V03.500, M79.672] Foot pain, bilateral   [Z59.00] Homelessness        No follow-up provider specified.    Patient was given scripts for the following medications.  New Prescriptions    No medications on file          Disposition: Discharge        [place], [place], [place]  11/18/22 1837

## 2022-11-18 NOTE — ED Triage Notes (Signed)
Here for foot pain. Ambulated for [facility].
